# Patient Record
Sex: Male | Born: 1970
Health system: Southern US, Community
[De-identification: ages and names within clinical notes are randomized; demographics above are authoritative.]

## PROBLEM LIST (undated history)

## (undated) DIAGNOSIS — I10 Essential (primary) hypertension: Secondary | ICD-10-CM

## (undated) DIAGNOSIS — K219 Gastro-esophageal reflux disease without esophagitis: Secondary | ICD-10-CM

## (undated) DIAGNOSIS — T7840XA Allergy, unspecified, initial encounter: Secondary | ICD-10-CM

## (undated) DIAGNOSIS — K222 Esophageal obstruction: Secondary | ICD-10-CM

## (undated) HISTORY — DX: Gastro-esophageal reflux disease without esophagitis: K21.9

## (undated) HISTORY — DX: Essential (primary) hypertension: I10

## (undated) HISTORY — PX: WISDOM TOOTH EXTRACTION: SHX21

## (undated) HISTORY — DX: Allergy, unspecified, initial encounter: T78.40XA

## (undated) HISTORY — PX: UPPER GASTROINTESTINAL ENDOSCOPY: SHX188

## (undated) HISTORY — DX: Esophageal obstruction: K22.2

---

## 2001-07-11 ENCOUNTER — Emergency Department (HOSPITAL_COMMUNITY): Admission: EM | Admit: 2001-07-11 | Discharge: 2001-07-12 | Payer: Self-pay | Admitting: Emergency Medicine

## 2012-02-03 ENCOUNTER — Ambulatory Visit (INDEPENDENT_AMBULATORY_CARE_PROVIDER_SITE_OTHER): Payer: 59 | Admitting: Sports Medicine

## 2012-02-03 ENCOUNTER — Encounter: Payer: Self-pay | Admitting: Sports Medicine

## 2012-02-03 VITALS — BP 143/94 | HR 89

## 2012-02-03 DIAGNOSIS — M25519 Pain in unspecified shoulder: Secondary | ICD-10-CM | POA: Insufficient documentation

## 2012-02-03 DIAGNOSIS — M722 Plantar fascial fibromatosis: Secondary | ICD-10-CM

## 2012-02-03 MED ORDER — MELOXICAM 15 MG PO TABS: 15.0000 mg | ORAL_TABLET | Freq: Every day | ORAL | Status: DC

## 2012-02-03 NOTE — Progress Notes (Signed)
Bobby Rodriguez is a 41 y.o. male who presents to [facility] today for  1) left interior shoulder pain: Pain present for approximately 6 months. Patient notes anterior/superior shoulder pain especially with overhead weightlifting. For example he is no longer able to do [employer] press pain-free.  He has had decreased his weight from 220 pounds to 130 pounds.  He denies any locking or catching radicular pain weakness or numbness.  He notes additionally activities of daily living such as putting his children on her shoulders causes pain.  He was seen by his primary care doctor who obtained an x-ray which was normal and prescribed ibuprofen.  He has failed to improve significantly.  2) right heel pain: Present for approximately one month.  Patient has increased his running recently and recently played volleyball game which exacerbated his pain.  He notes pain especially with the first step after a prolonged period of rest.  He denies any weakness numbness into his foot. He has not tried any therapies for this yet.     PMH reviewed. Otherwise healthy no diagnosis of hypertension History  Substance Use Topics  . Smoking status: Never Smoker   . Smokeless tobacco: Former Neurosurgeon    Quit date: 06/15/2007  . Alcohol Use: Not on file   ROS as above otherwise neg   Exam:  BP 143/94  Pulse 89 Gen: Well NAD HEENT: MMM EOMI Lungs: Nl WOB CV: 2+ pulses BL MSK:  Left Shoulder: Normal in appearance and nontender. Range of motion external limited to about 85. Internal forward flexion and abduction are normal. Strength 5/5 in all modalities Hawkins: Mildly positive. Neers negative Empty can mildly positive O'Brien: Mildly positive Crossover: Positive Clunk and grind test negative Apprehension test: Negative  Right shoulder: Normal appearance nontender range of motion limited to 85 external rotation however normal otherwise. Strength full negative impingement tests negative labrum tests.  Right foot:    Morton's foot.  Mild collapse of longitudinal arch.  Mildly tender at the insertion of the plantar fascia calcaneus.  Normal foot motion.  Normal heel varus on toe standing.  Musculoskeletal ultrasound of the left shoulder:  Biceps tendon: Normal-appearing and groove on long and short views Subscapularis: Normal appearing on long and short views. No impingement.  Supraspinatus: Normal appearing on long and short views. No significant subacromial bursitis. Negative impingement testing.  Infraspinatus: Normal appearing on long and short views. Teres Minor: Normal appearing on long view Posterior labrum: Difficult to assess no significant pathology noted A.c. Joint: Positive mushroomed sign. No significant calcifications within the joint.  Right a.c. Is similar in appearance though

## 2012-02-03 NOTE — Assessment & Plan Note (Signed)
Unsure of etiology of left shoulder pain. Likely coming from a.c. joint possibly overuse.  No significant arthritic changes. The contralateral joint which is nontender has similar ultrasound changes.  Discussed the possibility of the ultrasound-guided a.c. joint injection.  Patient elects for trial of nonsteroidals and relative rest.  Will come back in 6-8 weeks if not improved for consideration of injection versus other therapy.

## 2012-02-03 NOTE — Patient Instructions (Addendum)
Thank you for coming in today. 1) Shoulder Pain: Likely AC joint irritation.  Do not think Rotator Cuff injury.  Continue light weights over head.  Meloxicam daily for 2 weeks then as needed.   2) Foot: Think Plantar Fasciitis.  This is pain and will take a while to get better.  Wear the insoles and try the arch straps.  Do the toe back massage stretch.  Do those calf raises 15 reps 3 sets 2-3 times a day.   Come back in 4-6 weeks as needed.  If your shoulder is not getting  Better we will try an injection.   Recheck your blood pressure at a pharmacy next week.

## 2012-02-03 NOTE — Assessment & Plan Note (Signed)
Right-sided plantar fasciitis. Will use temporary sports insoles with arch straps. Discussed eccentric calf exercises as well as stretching massage. Followup as noted above.

## 2012-12-28 ENCOUNTER — Ambulatory Visit (INDEPENDENT_AMBULATORY_CARE_PROVIDER_SITE_OTHER): Payer: 59 | Admitting: Internal Medicine

## 2012-12-28 ENCOUNTER — Other Ambulatory Visit (INDEPENDENT_AMBULATORY_CARE_PROVIDER_SITE_OTHER): Payer: 59

## 2012-12-28 ENCOUNTER — Encounter: Payer: Self-pay | Admitting: Internal Medicine

## 2012-12-28 VITALS — BP 140/94 | HR 100 | Temp 98.2°F | Resp 16 | Ht 73.0 in | Wt 244.0 lb

## 2012-12-28 DIAGNOSIS — Z23 Encounter for immunization: Secondary | ICD-10-CM

## 2012-12-28 DIAGNOSIS — K222 Esophageal obstruction: Secondary | ICD-10-CM | POA: Insufficient documentation

## 2012-12-28 DIAGNOSIS — I10 Essential (primary) hypertension: Secondary | ICD-10-CM

## 2012-12-28 DIAGNOSIS — R1310 Dysphagia, unspecified: Secondary | ICD-10-CM

## 2012-12-28 DIAGNOSIS — K219 Gastro-esophageal reflux disease without esophagitis: Secondary | ICD-10-CM | POA: Insufficient documentation

## 2012-12-28 DIAGNOSIS — Z0000 Encounter for general adult medical examination without abnormal findings: Secondary | ICD-10-CM

## 2012-12-28 LAB — CBC WITH DIFFERENTIAL/PLATELET
Basophils Absolute: 0 10*3/uL (ref 0.0–0.1)
Eosinophils Absolute: 0.7 10*3/uL (ref 0.0–0.7)
HCT: 43.9 % (ref 39.0–52.0)
Lymphs Abs: 2.7 10*3/uL (ref 0.7–4.0)
Monocytes Relative: 8.7 % (ref 3.0–12.0)
Platelets: 214 10*3/uL (ref 150.0–400.0)
RDW: 13.2 % (ref 11.5–14.6)

## 2012-12-28 LAB — URINALYSIS, ROUTINE W REFLEX MICROSCOPIC
Bilirubin Urine: NEGATIVE
Ketones, ur: NEGATIVE
RBC / HPF: NONE SEEN (ref 0–?)
Specific Gravity, Urine: 1.02 (ref 1.000–1.030)
Total Protein, Urine: NEGATIVE
pH: 6 (ref 5.0–8.0)

## 2012-12-28 LAB — COMPREHENSIVE METABOLIC PANEL
ALT: 76 U/L — ABNORMAL HIGH (ref 0–53)
CO2: 30 mEq/L (ref 19–32)
Calcium: 9.6 mg/dL (ref 8.4–10.5)
Chloride: 101 mEq/L (ref 96–112)
GFR: 81.19 mL/min (ref >60.00)
Sodium: 137 mEq/L (ref 135–145)
Total Bilirubin: 0.8 mg/dL (ref 0.3–1.2)
Total Protein: 7.3 g/dL (ref 6.0–8.3)

## 2012-12-28 LAB — FECAL OCCULT BLOOD, GUAIAC: Fecal Occult Blood: NEGATIVE

## 2012-12-28 LAB — LIPID PANEL: Total CHOL/HDL Ratio: 5

## 2012-12-28 MED ORDER — DEXLANSOPRAZOLE 60 MG PO CPDR: 60.0000 mg | DELAYED_RELEASE_CAPSULE | Freq: Every day | ORAL | Status: DC

## 2012-12-28 MED ORDER — OLMESARTAN MEDOXOMIL 40 MG PO TABS: 40.0000 mg | ORAL_TABLET | Freq: Every day | ORAL | Status: DC

## 2012-12-28 NOTE — Assessment & Plan Note (Signed)
Start dexilant I am concerned that he may have some UGI pathology (Barrett's, stricture, ulceration, etc.) so I have asked him to see GI

## 2012-12-28 NOTE — Assessment & Plan Note (Signed)
Start dexliant

## 2012-12-28 NOTE — Progress Notes (Signed)
Subjective:    Patient ID: Bobby Rodriguez, male    DOB: 23-May-1971, 42 y.o.   MRN: [id number]  Gastrophageal Reflux He complains of belching, coughing, dysphagia and heartburn. He reports no abdominal pain, no chest pain, no choking, no early satiety, no globus sensation, no hoarse voice, no nausea, no sore throat, no stridor, no tooth decay, no water brash or no wheezing. This is a chronic problem. The current episode started more than 1 year ago. The problem occurs frequently. The problem has been gradually worsening. The heartburn is of moderate intensity. The heartburn wakes him from sleep. The heartburn does not limit his activity. The heartburn doesn't change with position. Nothing aggravates the symptoms. Associated symptoms include fatigue. Pertinent negatives include no anemia, melena, muscle weakness, orthopnea or weight loss. Risk factors include obesity. He has tried a histamine-2 antagonist for the symptoms. The treatment provided no relief.      Review of Systems  Constitutional: Positive for fatigue. Negative for fever, chills, weight loss, diaphoresis, activity change, appetite change and unexpected weight change.  HENT: Negative.  Negative for sore throat and hoarse voice.   Eyes: Negative.   Respiratory: Positive for cough. Negative for apnea, choking, chest tightness, shortness of breath, wheezing and stridor.   Cardiovascular: Negative.  Negative for chest pain, palpitations and leg swelling.  Gastrointestinal: Positive for heartburn, dysphagia and anal bleeding. Negative for nausea, vomiting, abdominal pain, constipation, melena, abdominal distention and rectal pain.  Endocrine: Negative.   Genitourinary: Negative.   Musculoskeletal: Negative.  Negative for muscle weakness.  Skin: Negative.   Allergic/Immunologic: Negative.   Neurological: Negative.   Hematological: Negative.   Psychiatric/Behavioral: Negative.        Objective:   Physical Exam  Vitals  reviewed. Constitutional: He is oriented to person, place, and time. He appears well-developed and well-nourished. No distress.  HENT:  Head: Normocephalic and atraumatic.  Mouth/Throat: Oropharynx is clear and moist. No oropharyngeal exudate.  Eyes: Conjunctivae are normal. Right eye exhibits no discharge. Left eye exhibits no discharge. No scleral icterus.  Neck: Normal range of motion. Neck supple. No JVD present. No tracheal deviation present. No thyromegaly present.  Cardiovascular: Normal rate, regular rhythm, normal heart sounds and intact distal pulses.  Exam reveals no gallop and no friction rub.   No murmur heard. Pulmonary/Chest: Effort normal and breath sounds normal. No stridor. No respiratory distress. He has no wheezes. He has no rales. He exhibits no tenderness.  Abdominal: Soft. Bowel sounds are normal. He exhibits no distension and no mass. There is no tenderness. There is no rebound and no guarding. Hernia confirmed negative in the right inguinal area and confirmed negative in the left inguinal area.  Genitourinary: Rectum normal, testes normal and penis normal. Rectal exam shows no external hemorrhoid, no internal hemorrhoid, no fissure, no mass, no tenderness and anal tone normal. Guaiac negative stool. Prostate is not enlarged and not tender. Right testis shows no mass, no swelling and no tenderness. Right testis is descended. Left testis shows no mass, no swelling and no tenderness. Left testis is descended. Circumcised. No penile erythema or penile tenderness. No discharge found.  Musculoskeletal: Normal range of motion. He exhibits no edema and no tenderness.  Lymphadenopathy:    He has no cervical adenopathy.       Right: No inguinal adenopathy present.       Left: No inguinal adenopathy present.  Neurological: He is oriented to person, place, and time.  Skin: Skin is warm and dry.  No rash noted. He is not diaphoretic. No erythema. No pallor.  Psychiatric: He has a normal  mood and affect. His behavior is normal. Judgment and thought content normal.          Assessment & Plan:

## 2012-12-28 NOTE — Patient Instructions (Signed)
Health Maintenance, Males A healthy lifestyle and preventative care can promote health and wellness.  Maintain regular health, dental, and eye exams.  Eat a healthy diet. Foods like vegetables, fruits, whole grains, low-fat dairy products, and lean protein foods contain the nutrients you need without too many calories. Decrease your intake of foods high in solid fats, added sugars, and salt. Get information about a proper diet from your caregiver, if necessary.  Regular physical exercise is one of the most important things you can do for your health. Most adults should get at least 150 minutes of moderate-intensity exercise (any activity that increases your heart rate and causes you to sweat) each week. In addition, most adults need muscle-strengthening exercises on 2 or more days a week.   Maintain a healthy weight. The body mass index ([name]) is a screening tool to identify possible weight problems. It provides an estimate of body fat based on height and weight. Your caregiver can help determine your [name], and can help you achieve or maintain a healthy weight. For adults 20 years and older:  A [name] below 18.5 is considered underweight.  A [name] of 18.5 to 24.9 is normal.  A [name] of 25 to 29.9 is considered overweight.  A [name] of 30 and above is considered obese.  Maintain normal blood lipids and cholesterol by exercising and minimizing your intake of saturated fat. Eat a balanced diet with plenty of fruits and vegetables. Blood tests for lipids and cholesterol should begin at age 20 and be repeated every 5 years. If your lipid or cholesterol levels are high, you are over 50, or you are a high risk for heart disease, you may need your cholesterol levels checked more frequently.Ongoing high lipid and cholesterol levels should be treated with medicines, if diet and exercise are not effective.  If you smoke, find out from your caregiver how to quit. If you do not use tobacco, do not start.  If you  choose to drink alcohol, do not exceed 2 drinks per day. One drink is considered to be 12 ounces (355 mL) of beer, 5 ounces (148 mL) of wine, or 1.5 ounces (44 mL) of liquor.  Avoid use of street drugs. Do not share needles with anyone. Ask for help if you need support or instructions about stopping the use of drugs.  High blood pressure causes heart disease and increases the risk of stroke. Blood pressure should be checked at least every 1 to 2 years. Ongoing high blood pressure should be treated with medicines if weight loss and exercise are not effective.  If you are 45 to 43 years old, ask your caregiver if you should take aspirin to prevent heart disease.  Diabetes screening involves taking a blood sample to check your fasting blood sugar level. This should be done once every 3 years, after age 45, if you are within normal weight and without risk factors for diabetes. Testing should be considered at a younger age or be carried out more frequently if you are overweight and have at least 1 risk factor for diabetes.  Colorectal cancer can be detected and often prevented. Most routine colorectal cancer screening begins at the age of 50 and continues through age 75. However, your caregiver may recommend screening at an earlier age if you have risk factors for colon cancer. On a yearly basis, your caregiver may provide home test kits to check for hidden blood in the stool. Use of a small camera at the end of a tube,   to directly examine the colon (sigmoidoscopy or colonoscopy), can detect the earliest forms of colorectal cancer. Talk to your caregiver about this at age 50, when routine screening begins. Direct examination of the colon should be repeated every 5 to 10 years through age 75, unless early forms of pre-cancerous polyps or small growths are found.  Hepatitis C blood testing is recommended for all people born from 1945 through 1965 and any individual with known risks for hepatitis C.  Healthy  men should no longer receive prostate-specific antigen (PSA) blood tests as part of routine cancer screening. Consult with your caregiver about prostate cancer screening.  Testicular cancer screening is not recommended for adolescents or adult males who have no symptoms. Screening includes self-exam, caregiver exam, and other screening tests. Consult with your caregiver about any symptoms you have or any concerns you have about testicular cancer.  Practice safe sex. Use condoms and avoid high-risk sexual practices to reduce the spread of sexually transmitted infections (STIs).  Use sunscreen with a sun protection factor (SPF) of 30 or greater. Apply sunscreen liberally and repeatedly throughout the day. You should seek shade when your shadow is shorter than you. Protect yourself by wearing long sleeves, pants, a wide-brimmed hat, and sunglasses year round, whenever you are outdoors.  Notify your caregiver of new moles or changes in moles, especially if there is a change in shape or color. Also notify your caregiver if a mole is larger than the size of a pencil eraser.  A one-time screening for abdominal aortic aneurysm (AAA) and surgical repair of large AAAs by sound wave imaging (ultrasonography) is recommended for ages 65 to 75 years who are current or former smokers.  Stay current with your immunizations. Document Released: 11/27/2007 Document Revised: 08/23/2011 Document Reviewed: 10/26/2010 ExitCare Patient Information 2014 [employer]. Gastroesophageal Reflux Disease, Adult Gastroesophageal reflux disease (GERD) happens when acid from your stomach flows up into the esophagus. When acid comes in contact with the esophagus, the acid causes soreness (inflammation) in the esophagus. Over time, GERD may create small holes (ulcers) in the lining of the esophagus. CAUSES   Increased body weight. This puts pressure on the stomach, making acid rise from the stomach into the esophagus.  Smoking.  This increases acid production in the stomach.  Drinking alcohol. This causes decreased pressure in the lower esophageal sphincter (valve or ring of muscle between the esophagus and stomach), allowing acid from the stomach into the esophagus.  Late evening meals and a full stomach. This increases pressure and acid production in the stomach.  A malformed lower esophageal sphincter. Sometimes, no cause is found. SYMPTOMS   Burning pain in the lower part of the mid-chest behind the breastbone and in the mid-stomach area. This may occur twice a week or more often.  Trouble swallowing.  Sore throat.  Dry cough.  Asthma-like symptoms including chest tightness, shortness of breath, or wheezing. DIAGNOSIS  Your caregiver may be able to diagnose GERD based on your symptoms. In some cases, X-rays and other tests may be done to check for complications or to check the condition of your stomach and esophagus. TREATMENT  Your caregiver may recommend over-the-counter or prescription medicines to help decrease acid production. Ask your caregiver before starting or adding any new medicines.  HOME CARE INSTRUCTIONS   Change the factors that you can control. Ask your caregiver for guidance concerning weight loss, quitting smoking, and alcohol consumption.  Avoid foods and drinks that make your symptoms worse, such as:    Caffeine or alcoholic drinks.  Chocolate.  Peppermint or mint flavorings.  Garlic and onions.  Spicy foods.  Citrus fruits, such as oranges, lemons, or limes.  Tomato-based foods such as sauce, chili, salsa, and pizza.  Fried and fatty foods.  Avoid lying down for the 3 hours prior to your bedtime or prior to taking a nap.  Eat small, frequent meals instead of large meals.  Wear loose-fitting clothing. Do not wear anything tight around your waist that causes pressure on your stomach.  Raise the head of your bed 6 to 8 inches with wood blocks to help you sleep. Extra  pillows will not help.  Only take over-the-counter or prescription medicines for pain, discomfort, or fever as directed by your caregiver.  Do not take aspirin, ibuprofen, or other nonsteroidal anti-inflammatory drugs (NSAIDs). SEEK IMMEDIATE MEDICAL CARE IF:   You have pain in your arms, neck, jaw, teeth, or back.  Your pain increases or changes in intensity or duration.  You develop nausea, vomiting, or sweating (diaphoresis).  You develop shortness of breath, or you faint.  Your vomit is green, yellow, black, or looks like coffee grounds or blood.  Your stool is red, bloody, or black. These symptoms could be signs of other problems, such as heart disease, gastric bleeding, or esophageal bleeding. MAKE SURE YOU:   Understand these instructions.  Will watch your condition.  Will get help right away if you are not doing well or get worse. Document Released: 03/10/2005 Document Revised: 08/23/2011 Document Reviewed: 12/18/2010 ExitCare Patient Information 2014 [employer].  

## 2012-12-28 NOTE — Assessment & Plan Note (Signed)
I have asked him to start benicar Today I will check his labs to look for end organ damage and secondary causes of HTN He will work on his lifestyle modifications

## 2012-12-28 NOTE — Assessment & Plan Note (Signed)
Exam done Tdap was updated Labs ordered Pt ed material was given 

## 2013-01-02 ENCOUNTER — Encounter: Payer: Self-pay | Admitting: Gastroenterology

## 2013-01-24 ENCOUNTER — Encounter: Payer: Self-pay | Admitting: Gastroenterology

## 2013-01-24 ENCOUNTER — Ambulatory Visit (INDEPENDENT_AMBULATORY_CARE_PROVIDER_SITE_OTHER): Payer: 59 | Admitting: Gastroenterology

## 2013-01-24 VITALS — BP 110/70 | HR 88 | Ht 73.0 in | Wt 242.8 lb

## 2013-01-24 DIAGNOSIS — R1319 Other dysphagia: Secondary | ICD-10-CM

## 2013-01-24 NOTE — Patient Instructions (Addendum)
You have been scheduled for an endoscopy with propofol. Please follow written instructions given to you at your visit today. If you use inhalers (even only as needed), please bring them with you on the day of your procedure. Your physician has requested that you go to www.startemmi.com and enter the access code given to you at your visit today. This web site gives a general overview about your procedure. However, you should still follow specific instructions given to you by our office regarding your preparation for the procedure.  Continue Dexilant daily.  Thank you for choosing me and  Gastroenterology.  Venita Lick. Pleas Koch., MD., Clement[facility] Graham

## 2013-01-24 NOTE — Progress Notes (Signed)
History of Present Illness: This is a 42 year old male relates a history of a hiatal hernia and reflux symptoms beginning about 15 years ago. He had intermittent solid food dysphagia at that time. He was treated with Prilosec and his symptoms improved. Over the past few years he has had an increasing frequency of solid food dysphagia and his symptoms have been particularly bothersome for the past 6 months to the point where he has avoided eating steak and eats only softer foods. He was recently started on Dexilant and his symptoms have improved. Denies weight loss, abdominal pain, constipation, diarrhea, change in stool caliber, melena, hematochezia, nausea, vomiting, chest pain.  Review of Systems: Pertinent positive and negative review of systems were noted in the above HPI section. All other review of systems were otherwise negative.  Current Medications, Allergies, Past Medical History, Past Surgical History, Family History and Social History were reviewed in [employer] record.  Physical Exam: General: Well developed , well nourished, no acute distress Head: Normocephalic and atraumatic Eyes:  sclerae anicteric, EOMI Ears: Normal auditory acuity Mouth: No deformity or lesions Neck: Supple, no masses or thyromegaly Lungs: Clear throughout to auscultation Heart: Regular rate and rhythm; no murmurs, rubs or bruits Abdomen: Soft, non tender and non distended. No masses, hepatosplenomegaly or hernias noted. Normal Bowel sounds Musculoskeletal: Symmetrical with no gross deformities  Skin: No lesions on visible extremities Pulses:  Normal pulses noted Extremities: No clubbing, cyanosis, edema or deformities noted Neurological: Alert oriented x 4, grossly nonfocal Cervical Nodes:  No significant cervical adenopathy Inguinal Nodes: No significant inguinal adenopathy Psychological:  Alert and cooperative. Normal mood and affect  Assessment and Recommendations:  1.  Dysphagia and GERD. Rule out stricture, esophagitis, Barrett's. Continue Dexilant 60 mg daily and all standard antireflux measures. Schedule EGD with possible dilation. The risks, benefits, and alternatives to endoscopy with possible biopsy and possible dilation were discussed with the patient and they consent to proceed.

## 2013-01-30 ENCOUNTER — Ambulatory Visit (AMBULATORY_SURGERY_CENTER): Payer: 59 | Admitting: Gastroenterology

## 2013-01-30 ENCOUNTER — Encounter: Payer: Self-pay | Admitting: Gastroenterology

## 2013-01-30 VITALS — BP 108/59 | HR 74 | Temp 97.9°F | Resp 20 | Ht 73.0 in | Wt 242.0 lb

## 2013-01-30 DIAGNOSIS — K219 Gastro-esophageal reflux disease without esophagitis: Secondary | ICD-10-CM

## 2013-01-30 DIAGNOSIS — K222 Esophageal obstruction: Secondary | ICD-10-CM

## 2013-01-30 DIAGNOSIS — R1319 Other dysphagia: Secondary | ICD-10-CM

## 2013-01-30 MED ORDER — SODIUM CHLORIDE 0.9 % IV SOLN: 500.0000 mL | INTRAVENOUS | Status: DC

## 2013-01-30 NOTE — Op Note (Signed)
[facility] [address] [place], [address]   ENDOSCOPY PROCEDURE REPORT  PATIENT: Bobby, Rodriguez  MR#: [id number] BIRTHDATE: Apr 01, 1971 , 41  yrs. old GENDER: Male ENDOSCOPIST: Meryl Dare, MD, [facility] REFERRED BY:  Etta Grandchild, M.D. PROCEDURE DATE:  01/30/2013 PROCEDURE:  EGD, diagnostic and Savary dilation of esophagus ASA CLASS:     Class I INDICATIONS:  Dysphagia.  History of esophageal reflux. MEDICATIONS: MAC sedation, administered by CRNA and propofol (Diprivan) 300mg  IV TOPICAL ANESTHETIC: Cetacaine Spray DESCRIPTION OF PROCEDURE: After the risks benefits and alternatives of the procedure were thoroughly explained, informed consent was obtained.  The LB [id number] endoscope was introduced through the mouth and advanced to the second portion of the duodenum  without limitations.  The instrument was slowly withdrawn as the mucosa was fully examined.  ESOPHAGUS: A stricture was found at the gastroesophageal junction. The stenosis was traversable with the endoscope. It measured about 11 mm in diameter. The esophagus was otherwise normal. STOMACH: The mucosa and folds of the stomach appeared normal. DUODENUM: The duodenal mucosa showed no abnormalities in the bulb and second portion of the duodenum.  Retroflexed views revealed no abnormalities.  A guidewire was placed and the scope was then withdrawn from the patient. 13 mm, 14 mm and 15 mm Savary dilators were passed over the guidewire with mild resistance noted to all 3 and a trace of heme on the last dilator and the procedure completed.  COMPLICATIONS: There were no complications.  ENDOSCOPIC IMPRESSION: 1.   Stricture at the gastroesophageal junction; dilated 2.   The EGD was otherwise normal  RECOMMENDATIONS: 1.  Anti-reflux regimen 2.  Continue PPI 3.  post dilation instructions 4.  OP follow-up in 4-6 weeks to assess response-if additional dilation is needed   eSigned:   Meryl Dare, MD, [facility] 01/30/2013 3:25 PM

## 2013-01-30 NOTE — Progress Notes (Signed)
Called to room to assist during endoscopic procedure.  Patient ID and intended procedure confirmed with present staff. Received instructions for my participation in the procedure from the performing physician.  

## 2013-01-30 NOTE — Patient Instructions (Addendum)
YOU HAD AN ENDOSCOPIC PROCEDURE TODAY AT THE [facility]: Refer to the procedure report that was given to you for any specific questions about what was found during the examination.  If the procedure report does not answer your questions, please call your gastroenterologist to clarify.  If you requested that your care partner not be given the details of your procedure findings, then the procedure report has been included in a sealed envelope for you to review at your convenience later.  YOU SHOULD EXPECT: Some feelings of bloating in the abdomen. Passage of more gas than usual.  Walking can help get rid of the air that was put into your GI tract during the procedure and reduce the bloating. If you had a lower endoscopy (such as a colonoscopy or flexible sigmoidoscopy) you may notice spotting of blood in your stool or on the toilet paper. If you underwent a bowel prep for your procedure, then you may not have a normal bowel movement for a few days.  DIET: Your first meal following the procedure should be a light meal and then it is ok to progress to your normal diet.  A half-sandwich or bowl of soup is an example of a good first meal.  Heavy or fried foods are harder to digest and may make you feel nauseous or bloated.  Likewise meals heavy in dairy and vegetables can cause extra gas to form and this can also increase the bloating.  Drink plenty of fluids but you should avoid alcoholic beverages for 24 hours.  ACTIVITY: Your care partner should take you home directly after the procedure.  You should plan to take it easy, moving slowly for the rest of the day.  You can resume normal activity the day after the procedure however you should NOT DRIVE or use heavy machinery for 24 hours (because of the sedation medicines used during the test).    SYMPTOMS TO REPORT IMMEDIATELY: A gastroenterologist can be reached at any hour.  During normal business hours, 8:30 AM to 5:00 PM Monday through Friday,  call [phone].  After hours and on weekends, please call the GI answering service at [phone] who will take a message and have the physician on call contact you.    Following upper endoscopy (EGD)  Vomiting of blood or coffee ground material  New chest pain or pain under the shoulder blades  Painful or persistently difficult swallowing  New shortness of breath  Fever of 100F or higher  Black, tarry-looking stools  FOLLOW UP: If any biopsies were taken you will be contacted by phone or by letter within the next 1-3 weeks.  Call your gastroenterologist if you have not heard about the biopsies in 3 weeks.  Our staff will call the home number listed on your records the next business day following your procedure to check on you and address any questions or concerns that you may have at that time regarding the information given to you following your procedure. This is a courtesy call and so if there is no answer at the home number and we have not heard from you through the emergency physician on call, we will assume that you have returned to your regular daily activities without incident.  SIGNATURES/CONFIDENTIALITY: You and/or your care partner have signed paperwork which will be entered into your electronic medical record.  These signatures attest to the fact that that the information above on your After Visit Summary has been reviewed and is understood.  Full   responsibility of the confidentiality of this discharge information lies with you and/or your care-partner.  Stricture information given.  Anti-reflux regimen.  Continue PPI   Post dilation diet given with instructions.  Follow up in 4-6 weeks to access dilation results.   September 29th at 9:15 AM.

## 2013-01-30 NOTE — Progress Notes (Signed)
Patient did not experience any of the following events: a burn prior to discharge; a fall within the facility; wrong site/side/patient/procedure/implant event; or a hospital transfer or hospital admission upon discharge from the facility. (G8907) Patient did not have preoperative order for IV antibiotic SSI prophylaxis. (G8918)  

## 2013-01-31 ENCOUNTER — Telehealth: Payer: Self-pay | Admitting: *Deleted

## 2013-01-31 NOTE — Telephone Encounter (Signed)
  Follow up Call-  Call back number 01/30/2013  Post procedure Call Back phone  # [phone]  Permission to leave phone message Yes     Patient questions:  Do you have a fever, pain , or abdominal swelling? no Pain Score  0 *  Have you tolerated food without any problems? yes  Have you been able to return to your normal activities? yes  Do you have any questions about your discharge instructions: Diet   no Medications  no Follow up visit  no  Do you have questions or concerns about your Care? no  Actions: * If pain score is 4 or above: No action needed, pain <4.  Pt. Wife provided answers to questions,pt. Had already gone to work.

## 2013-02-16 ENCOUNTER — Encounter: Payer: 59 | Admitting: Gastroenterology

## 2013-02-20 ENCOUNTER — Encounter: Payer: Self-pay | Admitting: Internal Medicine

## 2013-02-20 ENCOUNTER — Other Ambulatory Visit (INDEPENDENT_AMBULATORY_CARE_PROVIDER_SITE_OTHER): Payer: 59

## 2013-02-20 ENCOUNTER — Ambulatory Visit (INDEPENDENT_AMBULATORY_CARE_PROVIDER_SITE_OTHER): Payer: 59 | Admitting: Internal Medicine

## 2013-02-20 ENCOUNTER — Telehealth: Payer: Self-pay | Admitting: Internal Medicine

## 2013-02-20 VITALS — BP 118/64 | HR 80 | Temp 98.5°F | Resp 16 | Wt 241.0 lb

## 2013-02-20 DIAGNOSIS — R0683 Snoring: Secondary | ICD-10-CM

## 2013-02-20 DIAGNOSIS — R7401 Elevation of levels of liver transaminase levels: Secondary | ICD-10-CM

## 2013-02-20 DIAGNOSIS — R0609 Other forms of dyspnea: Secondary | ICD-10-CM

## 2013-02-20 DIAGNOSIS — R5381 Other malaise: Secondary | ICD-10-CM

## 2013-02-20 DIAGNOSIS — I10 Essential (primary) hypertension: Secondary | ICD-10-CM

## 2013-02-20 DIAGNOSIS — R0989 Other specified symptoms and signs involving the circulatory and respiratory systems: Secondary | ICD-10-CM

## 2013-02-20 DIAGNOSIS — K760 Fatty (change of) liver, not elsewhere classified: Secondary | ICD-10-CM | POA: Insufficient documentation

## 2013-02-20 DIAGNOSIS — E669 Obesity, unspecified: Secondary | ICD-10-CM

## 2013-02-20 LAB — COMPREHENSIVE METABOLIC PANEL
AST: 64 U/L — ABNORMAL HIGH (ref 0–37)
Albumin: 4.4 g/dL (ref 3.5–5.2)
Alkaline Phosphatase: 41 U/L (ref 39–117)
Potassium: 4.3 mEq/L (ref 3.5–5.1)
Sodium: 138 mEq/L (ref 135–145)
Total Protein: 7.2 g/dL (ref 6.0–8.3)

## 2013-02-20 LAB — HEPATITIS B SURFACE ANTIBODY,QUALITATIVE: Hep B S Ab: NEGATIVE

## 2013-02-20 LAB — HEPATITIS C ANTIBODY: HCV Ab: NEGATIVE

## 2013-02-20 NOTE — Progress Notes (Signed)
  Subjective:    Patient ID: Bobby Rodriguez, male    DOB: [birth year], 42 y.o.   MRN: [id number]  Hypertension This is a chronic problem. The current episode started more than 1 month ago. The problem has been gradually improving since onset. The problem is controlled. Associated symptoms include malaise/fatigue. Pertinent negatives include no anxiety, blurred vision, chest pain, headaches, neck pain, orthopnea, palpitations, peripheral edema, PND, shortness of breath or sweats. Past treatments include angiotensin blockers. The current treatment provides moderate improvement. There are no compliance problems.       Review of Systems  Constitutional: Positive for malaise/fatigue and fatigue. Negative for fever, chills, diaphoresis, activity change, appetite change and unexpected weight change.  HENT: Negative.  Negative for neck pain.   Eyes: Negative.  Negative for blurred vision.  Respiratory: Negative.  Negative for cough, choking, chest tightness, shortness of breath and stridor.   Cardiovascular: Negative.  Negative for chest pain, palpitations, orthopnea, leg swelling and PND.  Gastrointestinal: Negative.  Negative for nausea, vomiting, abdominal pain, diarrhea, constipation and blood in stool.  Endocrine: Negative.   Genitourinary: Negative.   Musculoskeletal: Negative.   Skin: Negative.   Allergic/Immunologic: Negative.   Neurological: Negative.  Negative for dizziness, tremors, seizures, syncope, facial asymmetry, speech difficulty, weakness, light-headedness, numbness and headaches.  Hematological: Negative.  Negative for adenopathy. Does not bruise/bleed easily.  Psychiatric/Behavioral: Negative.        Objective:   Physical Exam  Vitals reviewed. Constitutional: He is oriented to person, place, and time. He appears well-developed and well-nourished. No distress.  HENT:  Head: Normocephalic and atraumatic.  Mouth/Throat: Oropharynx is clear and moist. No oropharyngeal  exudate.  Eyes: Conjunctivae are normal. Right eye exhibits no discharge. Left eye exhibits no discharge. No scleral icterus.  Neck: Normal range of motion. Neck supple. No JVD present. No tracheal deviation present. No thyromegaly present.  Cardiovascular: Normal rate, regular rhythm, normal heart sounds and intact distal pulses.  Exam reveals no gallop and no friction rub.   No murmur heard. Pulmonary/Chest: Effort normal and breath sounds normal. No stridor. No respiratory distress. He has no wheezes. He has no rales. He exhibits no tenderness.  Abdominal: Soft. Bowel sounds are normal. He exhibits no distension and no mass. There is no tenderness. There is no rebound and no guarding.  Musculoskeletal: Normal range of motion. He exhibits no edema and no tenderness.  Lymphadenopathy:    He has no cervical adenopathy.  Neurological: He is oriented to person, place, and time.  Skin: Skin is warm and dry. No rash noted. He is not diaphoretic. No erythema. No pallor.  Psychiatric: He has a normal mood and affect. His behavior is normal. Judgment and thought content normal.     Lab Results  Component Value Date   WBC 8.9 12/28/2012   HGB 15.4 12/28/2012   HCT 43.9 12/28/2012   PLT 214.0 12/28/2012   GLUCOSE 81 12/28/2012   CHOL 201* 12/28/2012   TRIG 102.0 12/28/2012   HDL 37.90* 12/28/2012   LDLDIRECT 148.8 12/28/2012   ALT 76* 12/28/2012   AST 46* 12/28/2012   NA 137 12/28/2012   K 4.1 12/28/2012   CL 101 12/28/2012   CREATININE 1.1 12/28/2012   BUN 15 12/28/2012   CO2 30 12/28/2012   TSH 3.15 12/28/2012       Assessment & Plan:

## 2013-02-20 NOTE — Patient Instructions (Signed)

## 2013-02-20 NOTE — Telephone Encounter (Signed)
Error

## 2013-02-21 DIAGNOSIS — E669 Obesity, unspecified: Secondary | ICD-10-CM | POA: Insufficient documentation

## 2013-02-21 LAB — TESTOSTERONE, FREE, TOTAL, SHBG
Testosterone, Free: 77.8 pg/mL (ref 47.0–244.0)
Testosterone-% Free: 2.3 % (ref 1.6–2.9)

## 2013-02-21 LAB — HEPATITIS A ANTIBODY, TOTAL: Hep A Total Ab: NEGATIVE

## 2013-02-21 LAB — HEPATITIS B CORE ANTIBODY, TOTAL: Hep B Core Total Ab: NEGATIVE

## 2013-02-21 NOTE — Assessment & Plan Note (Signed)
His BP is well controlled Today I will check his lytes and renal function 

## 2013-02-21 NOTE — Assessment & Plan Note (Signed)
I will check his testosterone level to see if that it the cause for his fatigue. He does report snoring but his wife does not notice apnea, if the T level is normal then I will refer him for a sleep evaluation

## 2013-02-21 NOTE — Assessment & Plan Note (Signed)
He is working on his lifestyle modifications to lose weight 

## 2013-02-21 NOTE — Assessment & Plan Note (Signed)
This appears to be fatty liver disease I will recheck his LFT's today and will screen him for viral hepatitis

## 2013-02-22 ENCOUNTER — Encounter: Payer: Self-pay | Admitting: Internal Medicine

## 2013-02-22 DIAGNOSIS — R0683 Snoring: Secondary | ICD-10-CM | POA: Insufficient documentation

## 2013-02-22 NOTE — Addendum Note (Signed)
Addended by: Etta Grandchild on: 02/22/2013 08:25 AM   Modules accepted: Orders

## 2013-03-12 ENCOUNTER — Encounter: Payer: Self-pay | Admitting: Gastroenterology

## 2013-03-12 ENCOUNTER — Ambulatory Visit (INDEPENDENT_AMBULATORY_CARE_PROVIDER_SITE_OTHER): Payer: 59 | Admitting: Gastroenterology

## 2013-03-12 VITALS — BP 126/76 | HR 84 | Ht 72.25 in | Wt 248.5 lb

## 2013-03-12 DIAGNOSIS — K222 Esophageal obstruction: Secondary | ICD-10-CM

## 2013-03-12 DIAGNOSIS — K219 Gastro-esophageal reflux disease without esophagitis: Secondary | ICD-10-CM

## 2013-03-12 NOTE — Patient Instructions (Addendum)
Thank you for choosing me and [facility].  Malcolm T. Stark, Jr., MD., FACG  

## 2013-03-12 NOTE — Progress Notes (Signed)
History of Present Illness: This is a 42 year old male with GERD and peptic stricture. He recently underwent upper endoscopy with dilation. He has no dysphasia no reflux symptoms. He has no GI complaints.  Current Medications, Allergies, Past Medical History, Past Surgical History, Family History and Social History were reviewed in [employer] record.  Physical Exam: General: Well developed , well nourished, no acute distress Head: Normocephalic and atraumatic Eyes:  sclerae anicteric, EOMI Ears: Normal auditory acuity Mouth: No deformity or lesions Lungs: Clear throughout to auscultation Heart: Regular rate and rhythm; no murmurs, rubs or bruits Abdomen: Soft, non tender and non distended. No masses, hepatosplenomegaly or hernias noted. Normal Bowel sounds Musculoskeletal: Symmetrical with no gross deformities  Pulses:  Normal pulses noted Extremities: No clubbing, cyanosis, edema or deformities noted Neurological: Alert oriented x 4, grossly nonfocal Psychological:  Alert and cooperative. Normal mood and affect  Assessment and Recommendations:  1. GERD with peptic stricture. Continue daily PPI long-term along with antireflux measures. Followup as needed for recurrent dysphagia.  2. Colorectal cancer screening, average risk. Colonoscopy at age 63.

## 2013-03-27 ENCOUNTER — Institutional Professional Consult (permissible substitution): Payer: 59 | Admitting: Pulmonary Disease

## 2013-04-30 ENCOUNTER — Ambulatory Visit (INDEPENDENT_AMBULATORY_CARE_PROVIDER_SITE_OTHER): Payer: 59 | Admitting: Pulmonary Disease

## 2013-04-30 ENCOUNTER — Encounter: Payer: Self-pay | Admitting: Pulmonary Disease

## 2013-04-30 VITALS — BP 118/88 | HR 97 | Temp 98.0°F | Ht 73.0 in | Wt 239.0 lb

## 2013-04-30 DIAGNOSIS — R0989 Other specified symptoms and signs involving the circulatory and respiratory systems: Secondary | ICD-10-CM

## 2013-04-30 DIAGNOSIS — R0609 Other forms of dyspnea: Secondary | ICD-10-CM

## 2013-04-30 DIAGNOSIS — R0683 Snoring: Secondary | ICD-10-CM

## 2013-04-30 NOTE — Progress Notes (Signed)
Subjective:    Patient ID: Bobby Rodriguez, male    DOB: [birth year], 42 y.o.   MRN: [id number]  HPI The patient is a 42 year old male who I've been asked to see for possible obstructive sleep apnea.  He has been noted to have loud snoring by his wife, but she has never commented on an abnormal breathing pattern during sleep.  The patient denies having gasping arousals.  He only awakens once a night at the most, but is only rested 50% in the mornings upon arising.  He notes significant fatigue during the day as well as sleep pressure with periods of inactivity.  This can bother him occasionally while driving with his work.  He has some sleepiness in the evenings watching television or movies.  The patient states that his weight is up 20 pounds over the last few years, and his Epworth score today is abnormal at 14.   Sleep Questionnaire What time do you typically go to bed?( Between what hours) 10-11p 10-11p at 1451 on 04/30/13 by Maisie Fus, CMA How long does it take you to fall asleep? 10-68mins 10-63mins at 1451 on 04/30/13 by Maisie Fus, CMA How many times during the night do you wake up? 1 1 at 1451 on 04/30/13 by Maisie Fus, CMA What time do you get out of bed to start your day? 0600 0600 at 1451 on 04/30/13 by Maisie Fus, CMA Do you drive or operate heavy machinery in your occupation? No No at 1451 on 04/30/13 by Maisie Fus, CMA How much has your weight changed (up or down) over the past two years? (In pounds) 20 lb (9.072 kg)20 lb (9.072 kg) increase at 1451 on 04/30/13 by Maisie Fus, CMA Have you ever had a sleep study before? No No at 1451 on 04/30/13 by Maisie Fus, CMA Do you currently use CPAP? No No at 1451 on 04/30/13 by Maisie Fus, CMA Do you wear oxygen at any time? No No at 1451 on 04/30/13 by Maisie Fus, CMA   Review of Systems  Constitutional: Negative for fever and unexpected weight change.  HENT: Negative for congestion,  dental problem, ear pain, nosebleeds, postnasal drip, rhinorrhea, sinus pressure, sneezing, sore throat and trouble swallowing.   Eyes: Negative for redness and itching.  Respiratory: Negative for cough, chest tightness, shortness of breath and wheezing.   Cardiovascular: Negative for palpitations and leg swelling.  Gastrointestinal: Negative for nausea and vomiting.  Genitourinary: Negative for dysuria.  Musculoskeletal: Negative for joint swelling.  Skin: Negative for rash.  Neurological: Negative for headaches.  Hematological: Does not bruise/bleed easily.  Psychiatric/Behavioral: Negative for dysphoric mood. The patient is not nervous/anxious.        Objective:   Physical Exam Constitutional:  Overweight male, no acute distress  HENT:  Nares patent without discharge, septal deviation to left with narrowing.  Oropharynx without exudate, palate and uvula are moderately elongated.   Eyes:  Perrla, eomi, no scleral icterus  Neck:  No JVD, no TMG  Cardiovascular:  Normal rate, regular rhythm, no rubs or gallops.  No murmurs        Intact distal pulses  Pulmonary :  Normal breath sounds, no stridor or respiratory distress   No rales, rhonchi, or wheezing  Abdominal:  Soft, nondistended, bowel sounds present.  No tenderness noted.   Musculoskeletal:  No lower extremity edema noted.  Lymph Nodes:  No cervical lymphadenopathy noted  Skin:  No cyanosis noted  Neurologic:  Alert, appropriate, moves all 4 extremities without obvious deficit.         Assessment & Plan:

## 2013-04-30 NOTE — Assessment & Plan Note (Signed)
The patient has a history of loud snoring, as well as nonrestorative sleep and some degree of daytime sleepiness and fatigue.  It is unclear whether he has actual sleep apnea, or whether this is the upper airway resistance syndrome.  I have outlined a conservative approach with a trial of weight loss alone, versus proceeding with a sleep study.  The patient at this time would like to try and work on weight loss, however I have asked him to call me to arrange for a sleep study if he is unsuccessful.  He is agreeable to this approach.

## 2013-04-30 NOTE — Patient Instructions (Signed)
Work on weight loss for the next 6mos.  If you are making progress and feeling better, continue.   If you are not losing weight successfully, then would consider doing sleep study (if you have sleep apnea and we treat, then you may lose weight easier).  Please call to set up sleep study if you are not able to lose weight successfully.

## 2013-06-11 ENCOUNTER — Other Ambulatory Visit: Payer: Self-pay | Admitting: Internal Medicine

## 2013-11-21 ENCOUNTER — Other Ambulatory Visit: Payer: Self-pay | Admitting: Internal Medicine

## 2014-01-24 ENCOUNTER — Other Ambulatory Visit: Payer: Self-pay | Admitting: Internal Medicine

## 2014-02-22 ENCOUNTER — Encounter: Payer: Self-pay | Admitting: Internal Medicine

## 2014-02-22 ENCOUNTER — Other Ambulatory Visit (INDEPENDENT_AMBULATORY_CARE_PROVIDER_SITE_OTHER): Payer: 59

## 2014-02-22 ENCOUNTER — Ambulatory Visit (INDEPENDENT_AMBULATORY_CARE_PROVIDER_SITE_OTHER): Payer: 59 | Admitting: Internal Medicine

## 2014-02-22 VITALS — BP 124/80 | HR 75 | Temp 98.5°F | Resp 16 | Wt 249.0 lb

## 2014-02-22 DIAGNOSIS — R7309 Other abnormal glucose: Secondary | ICD-10-CM

## 2014-02-22 DIAGNOSIS — Z0000 Encounter for general adult medical examination without abnormal findings: Secondary | ICD-10-CM

## 2014-02-22 DIAGNOSIS — E669 Obesity, unspecified: Secondary | ICD-10-CM

## 2014-02-22 DIAGNOSIS — R7401 Elevation of levels of liver transaminase levels: Secondary | ICD-10-CM

## 2014-02-22 DIAGNOSIS — R740 Nonspecific elevation of levels of transaminase and lactic acid dehydrogenase [LDH]: Secondary | ICD-10-CM

## 2014-02-22 DIAGNOSIS — I10 Essential (primary) hypertension: Secondary | ICD-10-CM

## 2014-02-22 LAB — COMPREHENSIVE METABOLIC PANEL
ALK PHOS: 39 U/L (ref 39–117)
ALT: 115 U/L — AB (ref 0–53)
AST: 66 U/L — AB (ref 0–37)
Albumin: 4.2 g/dL (ref 3.5–5.2)
BILIRUBIN TOTAL: 0.9 mg/dL (ref 0.2–1.2)
BUN: 14 mg/dL (ref 6–23)
CALCIUM: 9.1 mg/dL (ref 8.4–10.5)
CHLORIDE: 101 meq/L (ref 96–112)
CO2: 28 mEq/L (ref 19–32)
CREATININE: 1.1 mg/dL (ref 0.4–1.5)
GFR: 80.75 mL/min (ref >60.00)
Glucose, Bld: 103 mg/dL — ABNORMAL HIGH (ref 70–99)
Potassium: 4.5 mEq/L (ref 3.5–5.1)
Sodium: 136 mEq/L (ref 135–145)
Total Protein: 7.2 g/dL (ref 6.0–8.3)

## 2014-02-22 LAB — LIPID PANEL
CHOL/HDL RATIO: 7
CHOLESTEROL: 228 mg/dL — AB (ref 0–200)
HDL: 34.4 mg/dL — ABNORMAL LOW (ref >39.00)
LDL CALC: 169 mg/dL — AB (ref 0–99)
NonHDL: 193.6
Triglycerides: 121 mg/dL (ref 0.0–149.0)
VLDL: 24.2 mg/dL (ref 0.0–40.0)

## 2014-02-22 LAB — CBC WITH DIFFERENTIAL/PLATELET
Basophils Absolute: 0 10*3/uL (ref 0.0–0.1)
Basophils Relative: 0.5 % (ref 0.0–3.0)
EOS ABS: 0.4 10*3/uL (ref 0.0–0.7)
Eosinophils Relative: 5.9 % — ABNORMAL HIGH (ref 0.0–5.0)
HEMATOCRIT: 42.6 % (ref 39.0–52.0)
HEMOGLOBIN: 14.6 g/dL (ref 13.0–17.0)
LYMPHS ABS: 2.8 10*3/uL (ref 0.7–4.0)
Lymphocytes Relative: 37.9 % (ref 12.0–46.0)
MCHC: 34.3 g/dL (ref 30.0–36.0)
MCV: 94.3 fl (ref 78.0–100.0)
Monocytes Absolute: 0.7 10*3/uL (ref 0.1–1.0)
Monocytes Relative: 9.9 % (ref 3.0–12.0)
NEUTROS ABS: 3.4 10*3/uL (ref 1.4–7.7)
Neutrophils Relative %: 45.8 % (ref 43.0–77.0)
Platelets: 214 10*3/uL (ref 150.0–400.0)
RBC: 4.52 Mil/uL (ref 4.22–5.81)
RDW: 12.7 % (ref 11.5–15.5)
WBC: 7.5 10*3/uL (ref 4.0–10.5)

## 2014-02-22 LAB — TSH: TSH: 4.64 u[IU]/mL — ABNORMAL HIGH (ref 0.35–4.50)

## 2014-02-22 LAB — HEMOGLOBIN A1C: Hgb A1c MFr Bld: 5.3 % (ref 4.6–6.5)

## 2014-02-22 NOTE — Assessment & Plan Note (Signed)
His BP is well controlled 

## 2014-02-22 NOTE — Assessment & Plan Note (Signed)
Exam done Vaccines were reviewed and updated Labs ordered Pt ed material was given 

## 2014-02-22 NOTE — Progress Notes (Signed)
Subjective:    Patient ID: Bobby Rodriguez, male    DOB: 01/27/1971, 43 y.o.   MRN: [id number]  Hypertension This is a chronic problem. The current episode started more than 1 year ago. The problem has been gradually improving since onset. The problem is controlled. Pertinent negatives include no anxiety, blurred vision, chest pain, headaches, malaise/fatigue, neck pain, orthopnea, palpitations, peripheral edema, PND, shortness of breath or sweats. Agents associated with hypertension include NSAIDs. Risk factors for coronary artery disease include obesity and male gender. Past treatments include angiotensin blockers. The current treatment provides moderate improvement. There are no compliance problems.       Review of Systems  Constitutional: Negative.  Negative for fever, chills, malaise/fatigue, diaphoresis, appetite change and fatigue.  HENT: Negative.   Eyes: Negative.  Negative for blurred vision.  Respiratory: Negative for apnea, cough, choking, chest tightness, shortness of breath, wheezing and stridor.   Cardiovascular: Negative.  Negative for chest pain, palpitations, orthopnea, leg swelling and PND.  Gastrointestinal: Negative.  Negative for nausea, vomiting, abdominal pain, diarrhea, constipation and blood in stool.  Endocrine: Negative.   Genitourinary: Negative.   Musculoskeletal: Negative.  Negative for neck pain.  Skin: Negative.   Allergic/Immunologic: Negative.   Neurological: Negative.  Negative for tremors, seizures, syncope, facial asymmetry, speech difficulty, weakness, light-headedness, numbness and headaches.  Hematological: Negative.  Negative for adenopathy. Does not bruise/bleed easily.  Psychiatric/Behavioral: Negative.        Objective:   Physical Exam  Vitals reviewed. Constitutional: He is oriented to person, place, and time. He appears well-developed and well-nourished. No distress.  HENT:  Head: Normocephalic and atraumatic.  Mouth/Throat:  Oropharynx is clear and moist. No oropharyngeal exudate.  Eyes: Conjunctivae are normal. Right eye exhibits no discharge. Left eye exhibits no discharge. No scleral icterus.  Neck: Normal range of motion. Neck supple. No JVD present. No tracheal deviation present. No thyromegaly present.  Cardiovascular: Normal rate, regular rhythm, normal heart sounds and intact distal pulses.  Exam reveals no gallop and no friction rub.   No murmur heard. Pulmonary/Chest: Effort normal and breath sounds normal. No stridor. No respiratory distress. He has no wheezes. He has no rales. He exhibits no tenderness.  Abdominal: Soft. Normal appearance and bowel sounds are normal. He exhibits no shifting dullness, no distension, no pulsatile liver, no fluid wave, no abdominal bruit, no ascites, no pulsatile midline mass and no mass. There is no hepatosplenomegaly, splenomegaly or hepatomegaly. There is no tenderness. There is no rebound, no guarding and no CVA tenderness. No hernia. Hernia confirmed negative in the ventral area, confirmed negative in the right inguinal area and confirmed negative in the left inguinal area.  Genitourinary: Testes normal and penis normal. Right testis shows no mass, no swelling and no tenderness. Right testis is descended. Left testis shows no mass, no swelling and no tenderness. Left testis is descended. Circumcised. No penile erythema or penile tenderness. No discharge found.  Musculoskeletal: Normal range of motion. He exhibits no edema and no tenderness.  Lymphadenopathy:    He has no cervical adenopathy.       Right: No inguinal adenopathy present.       Left: No inguinal adenopathy present.  Neurological: He is oriented to person, place, and time.  Skin: Skin is warm and dry. No rash noted. He is not diaphoretic. No erythema. No pallor.     Lab Results  Component Value Date   WBC 8.9 12/28/2012   HGB 15.4 12/28/2012   HCT  43.9 12/28/2012   PLT 214.0 12/28/2012   GLUCOSE 102*  02/20/2013   CHOL 201* 12/28/2012   TRIG 102.0 12/28/2012   HDL 37.90* 12/28/2012   LDLDIRECT 148.8 12/28/2012   ALT 105* 02/20/2013   AST 64* 02/20/2013   NA 138 02/20/2013   K 4.3 02/20/2013   CL 103 02/20/2013   CREATININE 1.2 02/20/2013   BUN 14 02/20/2013   CO2 28 02/20/2013   TSH 3.15 12/28/2012       Assessment & Plan:

## 2014-02-22 NOTE — Assessment & Plan Note (Signed)
He is working on his lifestyle modifications 

## 2014-02-22 NOTE — Progress Notes (Signed)
Pre visit review using our clinic review tool, if applicable. No additional management support is needed unless otherwise documented below in the visit note. 

## 2014-02-22 NOTE — Patient Instructions (Signed)
Hypertension Hypertension, commonly called high blood pressure, is when the force of blood pumping through your arteries is too strong. Your arteries are the blood vessels that carry blood from your heart throughout your body. A blood pressure reading consists of a higher number over a lower number, such as 110/72. The higher number (systolic) is the pressure inside your arteries when your heart pumps. The lower number (diastolic) is the pressure inside your arteries when your heart relaxes. Ideally you want your blood pressure below 120/80. Hypertension forces your heart to work harder to pump blood. Your arteries may become narrow or stiff. Having hypertension puts you at risk for heart disease, stroke, and other problems.  RISK FACTORS Some risk factors for high blood pressure are controllable. Others are not.  Risk factors you cannot control include:   Race. You may be at higher risk if you are African American.  Age. Risk increases with age.  Gender. Men are at higher risk than women before age 45 years. After age 65, women are at higher risk than men. Risk factors you can control include:  Not getting enough exercise or physical activity.  Being overweight.  Getting too much fat, sugar, calories, or salt in your diet.  Drinking too much alcohol. SIGNS AND SYMPTOMS Hypertension does not usually cause signs or symptoms. Extremely high blood pressure (hypertensive crisis) may cause headache, anxiety, shortness of breath, and nosebleed. DIAGNOSIS  To check if you have hypertension, your health care provider will measure your blood pressure while you are seated, with your arm held at the level of your heart. It should be measured at least twice using the same arm. Certain conditions can cause a difference in blood pressure between your right and left arms. A blood pressure reading that is higher than normal on one occasion does not mean that you need treatment. If one blood pressure reading  is high, ask your health care provider about having it checked again. TREATMENT  Treating high blood pressure includes making lifestyle changes and possibly taking medicine. Living a healthy lifestyle can help lower high blood pressure. You may need to change some of your habits. Lifestyle changes may include:  Following the DASH diet. This diet is high in fruits, vegetables, and whole grains. It is low in salt, red meat, and added sugars.  Getting at least 2 hours of brisk physical activity every week.  Losing weight if necessary.  Not smoking.  Limiting alcoholic beverages.  Learning ways to reduce stress. If lifestyle changes are not enough to get your blood pressure under control, your health care provider may prescribe medicine. You may need to take more than one. Work closely with your health care provider to understand the risks and benefits. HOME CARE INSTRUCTIONS  Have your blood pressure rechecked as directed by your health care provider.   Take medicines only as directed by your health care provider. Follow the directions carefully. Blood pressure medicines must be taken as prescribed. The medicine does not work as well when you skip doses. Skipping doses also puts you at risk for problems.   Do not smoke.   Monitor your blood pressure at home as directed by your health care provider. SEEK MEDICAL CARE IF:   You think you are having a reaction to medicines taken.  You have recurrent headaches or feel dizzy.  You have swelling in your ankles.  You have trouble with your vision. SEEK IMMEDIATE MEDICAL CARE IF:  You develop a severe headache or confusion.    You have unusual weakness, numbness, or feel faint.  You have severe chest or abdominal pain.  You vomit repeatedly.  You have trouble breathing. MAKE SURE YOU:   Understand these instructions.  Will watch your condition.  Will get help right away if you are not doing well or get worse. Document  Released: 05/31/2005 Document Revised: 10/15/2013 Document Reviewed: 03/23/2013 Exit[employer] Patient Information 2015 [employer]. This information is not intended to replace advice given to you by your health care provider. Make sure you discuss any questions you have with your health care provider. Health Maintenance A healthy lifestyle and preventative care can promote health and wellness.  Maintain regular health, dental, and eye exams.  Eat a healthy diet. Foods like vegetables, fruits, whole grains, low-fat dairy products, and lean protein foods contain the nutrients you need and are low in calories. Decrease your intake of foods high in solid fats, added sugars, and salt. Get information about a proper diet from your health care provider, if necessary.  Regular physical exercise is one of the most important things you can do for your health. Most adults should get at least 150 minutes of moderate-intensity exercise (any activity that increases your heart rate and causes you to sweat) each week. In addition, most adults need muscle-strengthening exercises on 2 or more days a week.   Maintain a healthy weight. The body mass index ([name]) is a screening tool to identify possible weight problems. It provides an estimate of body fat based on height and weight. Your health care provider can find your [name] and can help you achieve or maintain a healthy weight. For males 20 years and older:  A [name] below 18.5 is considered underweight.  A [name] of 18.5 to 24.9 is normal.  A [name] of 25 to 29.9 is considered overweight.  A [name] of 30 and above is considered obese.  Maintain normal blood lipids and cholesterol by exercising and minimizing your intake of saturated fat. Eat a balanced diet with plenty of fruits and vegetables. Blood tests for lipids and cholesterol should begin at age 20 and be repeated every 5 years. If your lipid or cholesterol levels are high, you are over age 50, or you are at high risk  for heart disease, you may need your cholesterol levels checked more frequently.Ongoing high lipid and cholesterol levels should be treated with medicines if diet and exercise are not working.  If you smoke, find out from your health care provider how to quit. If you do not use tobacco, do not start.  Lung cancer screening is recommended for adults aged 55-80 years who are at high risk for developing lung cancer because of a history of smoking. A yearly low-dose CT scan of the lungs is recommended for people who have at least a 30-pack-year history of smoking and are current smokers or have quit within the past 15 years. A pack year of smoking is smoking an average of 1 pack of cigarettes a day for 1 year (for example, a 30-pack-year history of smoking could mean smoking 1 pack a day for 30 years or 2 packs a day for 15 years). Yearly screening should continue until the smoker has stopped smoking for at least 15 years. Yearly screening should be stopped for people who develop a health problem that would prevent them from having lung cancer treatment.  If you choose to drink alcohol, do not have more than 2 drinks per day. One drink is considered to be 12 oz (  360 mL) of beer, 5 oz (150 mL) of wine, or 1.5 oz (45 mL) of liquor.  Avoid the use of street drugs. Do not share needles with anyone. Ask for help if you need support or instructions about stopping the use of drugs.  High blood pressure causes heart disease and increases the risk of stroke. Blood pressure should be checked at least every 1-2 years. Ongoing high blood pressure should be treated with medicines if weight loss and exercise are not effective.  If you are 45-79 years old, ask your health care provider if you should take aspirin to prevent heart disease.  Diabetes screening involves taking a blood sample to check your fasting blood sugar level. This should be done once every 3 years after age 45 if you are at a normal weight and without  risk factors for diabetes. Testing should be considered at a younger age or be carried out more frequently if you are overweight and have at least 1 risk factor for diabetes.  Colorectal cancer can be detected and often prevented. Most routine colorectal cancer screening begins at the age of 50 and continues through age 75. However, your health care provider may recommend screening at an earlier age if you have risk factors for colon cancer. On a yearly basis, your health care provider may provide home test kits to check for hidden blood in the stool. A small camera at the end of a tube may be used to directly examine the colon (sigmoidoscopy or colonoscopy) to detect the earliest forms of colorectal cancer. Talk to your health care provider about this at age 50 when routine screening begins. A direct exam of the colon should be repeated every 5-10 years through age 75, unless early forms of precancerous polyps or small growths are found.  People who are at an increased risk for hepatitis B should be screened for this virus. You are considered at high risk for hepatitis B if:  You were born in a country where hepatitis B occurs often. Talk with your health care provider about which countries are considered high risk.  Your parents were born in a high-risk country and you have not received a shot to protect against hepatitis B (hepatitis B vaccine).  You have HIV or AIDS.  You use needles to inject street drugs.  You live with, or have sex with, someone who has hepatitis B.  You are a man who has sex with other men (MSM).  You get hemodialysis treatment.  You take certain medicines for conditions like cancer, organ transplantation, and autoimmune conditions.  Hepatitis C blood testing is recommended for all people born from 1945 through 1965 and any individual with known risk factors for hepatitis C.  Healthy men should no longer receive prostate-specific antigen (PSA) blood tests as part of  routine cancer screening. Talk to your health care provider about prostate cancer screening.  Testicular cancer screening is not recommended for adolescents or adult males who have no symptoms. Screening includes self-exam, a health care provider exam, and other screening tests. Consult with your health care provider about any symptoms you have or any concerns you have about testicular cancer.  Practice safe sex. Use condoms and avoid high-risk sexual practices to reduce the spread of sexually transmitted infections (STIs).  You should be screened for STIs, including gonorrhea and chlamydia if:  You are sexually active and are younger than 24 years.  You are older than 24 years, and your health care provider tells you   that you are at risk for this type of infection.  Your sexual activity has changed since you were last screened, and you are at an increased risk for chlamydia or gonorrhea. Ask your health care provider if you are at risk.  If you are at risk of being infected with HIV, it is recommended that you take a prescription medicine daily to prevent HIV infection. This is called pre-exposure prophylaxis (PrEP). You are considered at risk if:  You are a man who has sex with other men (MSM).  You are a heterosexual man who is sexually active with multiple partners.  You take drugs by injection.  You are sexually active with a partner who has HIV.  Talk with your health care provider about whether you are at high risk of being infected with HIV. If you choose to begin PrEP, you should first be tested for HIV. You should then be tested every 3 months for as long as you are taking PrEP.  Use sunscreen. Apply sunscreen liberally and repeatedly throughout the day. You should seek shade when your shadow is shorter than you. Protect yourself by wearing long sleeves, pants, a wide-brimmed hat, and sunglasses year round whenever you are outdoors.  Tell your health care provider of new moles  or changes in moles, especially if there is a change in shape or color. Also, tell your health care provider if a mole is larger than the size of a pencil eraser.  A one-time screening for abdominal aortic aneurysm (AAA) and surgical repair of large AAAs by ultrasound is recommended for men aged 65-75 years who are current or former smokers.  Stay current with your vaccines (immunizations). Document Released: 11/27/2007 Document Revised: 06/05/2013 Document Reviewed: 10/26/2010 ExitCare Patient Information 2015 [employer]. This information is not intended to replace advice given to you by your health care provider. Make sure you discuss any questions you have with your health care provider.  

## 2014-02-22 NOTE — Assessment & Plan Note (Signed)
LFT's remain elevated I think he has fatty liver, will get an U/S for confirmation

## 2014-02-22 NOTE — Assessment & Plan Note (Signed)
I will check his A1C to see if he has developed DM2 

## 2014-03-04 ENCOUNTER — Encounter: Payer: Self-pay | Admitting: Internal Medicine

## 2014-03-04 ENCOUNTER — Ambulatory Visit
Admission: RE | Admit: 2014-03-04 | Discharge: 2014-03-04 | Disposition: A | Discharge status: Home / Self Care | Payer: 59 | Source: Ambulatory Visit | Attending: Internal Medicine | Admitting: Internal Medicine

## 2014-03-04 DIAGNOSIS — R7402 Elevation of levels of lactic acid dehydrogenase (LDH): Secondary | ICD-10-CM

## 2014-03-04 DIAGNOSIS — R740 Nonspecific elevation of levels of transaminase and lactic acid dehydrogenase [LDH]: Principal | ICD-10-CM

## 2014-03-04 DIAGNOSIS — R7401 Elevation of levels of liver transaminase levels: Secondary | ICD-10-CM

## 2014-08-12 ENCOUNTER — Other Ambulatory Visit: Payer: Self-pay | Admitting: Internal Medicine

## 2015-04-09 ENCOUNTER — Telehealth: Payer: Self-pay | Admitting: Internal Medicine

## 2015-04-09 ENCOUNTER — Other Ambulatory Visit: Payer: Self-pay

## 2015-04-09 MED ORDER — DEXLANSOPRAZOLE 60 MG PO CPDR: 1.0000 | DELAYED_RELEASE_CAPSULE | Freq: Every day | ORAL | Status: DC

## 2015-04-09 MED ORDER — OLMESARTAN MEDOXOMIL 40 MG PO TABS: 40.0000 mg | ORAL_TABLET | Freq: Every day | ORAL | Status: DC

## 2015-04-09 NOTE — Telephone Encounter (Signed)
Pt's insurance has changed their policy on their prescriptions and they are needing 90 day supply prescriptions for DEXILANT 60 MG capsule [[id number] and BENICAR 40 MG tablet [[id number] Pharmacy is [facility] on [address] [place]

## 2015-04-09 NOTE — Telephone Encounter (Signed)
90 day supply sent. Noted overdue for yearly.

## 2015-07-14 ENCOUNTER — Other Ambulatory Visit: Payer: Self-pay | Admitting: Internal Medicine

## 2015-07-21 ENCOUNTER — Other Ambulatory Visit (INDEPENDENT_AMBULATORY_CARE_PROVIDER_SITE_OTHER): Payer: BLUE CROSS/BLUE SHIELD

## 2015-07-21 ENCOUNTER — Encounter: Payer: Self-pay | Admitting: Internal Medicine

## 2015-07-21 ENCOUNTER — Ambulatory Visit (INDEPENDENT_AMBULATORY_CARE_PROVIDER_SITE_OTHER): Payer: BLUE CROSS/BLUE SHIELD | Admitting: Internal Medicine

## 2015-07-21 ENCOUNTER — Other Ambulatory Visit: Payer: Self-pay | Admitting: Internal Medicine

## 2015-07-21 VITALS — BP 120/84 | HR 85 | Temp 98.6°F | Resp 16 | Ht 73.0 in | Wt 255.0 lb

## 2015-07-21 DIAGNOSIS — R946 Abnormal results of thyroid function studies: Secondary | ICD-10-CM | POA: Diagnosis not present

## 2015-07-21 DIAGNOSIS — Z0000 Encounter for general adult medical examination without abnormal findings: Secondary | ICD-10-CM

## 2015-07-21 DIAGNOSIS — R739 Hyperglycemia, unspecified: Secondary | ICD-10-CM

## 2015-07-21 DIAGNOSIS — R7989 Other specified abnormal findings of blood chemistry: Secondary | ICD-10-CM | POA: Insufficient documentation

## 2015-07-21 DIAGNOSIS — L301 Dyshidrosis [pompholyx]: Secondary | ICD-10-CM

## 2015-07-21 LAB — LIPID PANEL
CHOL/HDL RATIO: 5
CHOLESTEROL: 234 mg/dL — AB (ref 0–200)
HDL: 42.7 mg/dL (ref >39.00)
LDL Cholesterol: 173 mg/dL — ABNORMAL HIGH (ref 0–99)
NonHDL: 190.95
Triglycerides: 90 mg/dL (ref 0.0–149.0)
VLDL: 18 mg/dL (ref 0.0–40.0)

## 2015-07-21 LAB — URINALYSIS, ROUTINE W REFLEX MICROSCOPIC
Bilirubin Urine: NEGATIVE
Hgb urine dipstick: NEGATIVE
KETONES UR: NEGATIVE
Leukocytes, UA: NEGATIVE
NITRITE: NEGATIVE
RBC / HPF: NONE SEEN (ref 0–?)
Specific Gravity, Urine: 1.01 (ref 1.000–1.030)
Total Protein, Urine: NEGATIVE
URINE GLUCOSE: NEGATIVE
UROBILINOGEN UA: 0.2 (ref 0.0–1.0)
WBC UA: NONE SEEN (ref 0–?)
pH: 7 (ref 5.0–8.0)

## 2015-07-21 LAB — COMPREHENSIVE METABOLIC PANEL
ALT: 52 U/L (ref 0–53)
AST: 33 U/L (ref 0–37)
Albumin: 4.5 g/dL (ref 3.5–5.2)
Alkaline Phosphatase: 44 U/L (ref 39–117)
BUN: 13 mg/dL (ref 6–23)
CHLORIDE: 103 meq/L (ref 96–112)
CO2: 29 mEq/L (ref 19–32)
Calcium: 10.1 mg/dL (ref 8.4–10.5)
Creatinine, Ser: 1.01 mg/dL (ref 0.40–1.50)
GFR: 84.84 mL/min (ref >60.00)
GLUCOSE: 95 mg/dL (ref 70–99)
Potassium: 4.5 mEq/L (ref 3.5–5.1)
SODIUM: 140 meq/L (ref 135–145)
Total Bilirubin: 0.4 mg/dL (ref 0.2–1.2)
Total Protein: 7.3 g/dL (ref 6.0–8.3)

## 2015-07-21 LAB — CBC WITH DIFFERENTIAL/PLATELET
BASOS ABS: 0 10*3/uL (ref 0.0–0.1)
Basophils Relative: 0.5 % (ref 0.0–3.0)
EOS ABS: 0.3 10*3/uL (ref 0.0–0.7)
Eosinophils Relative: 4.3 % (ref 0.0–5.0)
HCT: 42.8 % (ref 39.0–52.0)
HEMOGLOBIN: 14.5 g/dL (ref 13.0–17.0)
LYMPHS PCT: 39.9 % (ref 12.0–46.0)
Lymphs Abs: 2.8 10*3/uL (ref 0.7–4.0)
MCHC: 34 g/dL (ref 30.0–36.0)
MCV: 91.8 fl (ref 78.0–100.0)
Monocytes Absolute: 0.7 10*3/uL (ref 0.1–1.0)
Monocytes Relative: 9.8 % (ref 3.0–12.0)
NEUTROS PCT: 45.5 % (ref 43.0–77.0)
Neutro Abs: 3.2 10*3/uL (ref 1.4–7.7)
Platelets: 208 10*3/uL (ref 150.0–400.0)
RBC: 4.66 Mil/uL (ref 4.22–5.81)
RDW: 12.3 % (ref 11.5–15.5)
WBC: 7 10*3/uL (ref 4.0–10.5)

## 2015-07-21 LAB — TSH: TSH: 4.33 u[IU]/mL (ref 0.35–4.50)

## 2015-07-21 LAB — T3 UPTAKE: T3 Uptake: 31 % (ref 22–35)

## 2015-07-21 LAB — T4, FREE: Free T4: 0.94 ng/dL (ref 0.60–1.60)

## 2015-07-21 LAB — HEMOGLOBIN A1C: Hgb A1c MFr Bld: 5.2 % (ref 4.6–6.5)

## 2015-07-21 LAB — T3: T3, Total: 100 ng/dL (ref 76–181)

## 2015-07-21 MED ORDER — FLUOCINONIDE-E 0.05 % EX CREA: 1.0000 "application " | TOPICAL_CREAM | Freq: Two times a day (BID) | CUTANEOUS | Status: DC

## 2015-07-21 NOTE — Progress Notes (Signed)
Pre visit review using our clinic review tool, if applicable. No additional management support is needed unless otherwise documented below in the visit note. 

## 2015-07-21 NOTE — Patient Instructions (Signed)

## 2015-07-21 NOTE — Progress Notes (Signed)
Subjective:  Patient ID: Bobby Rodriguez, male    DOB: [birth year]  Age: 45 y.o. MRN: [id number]  CC: Hypertension; Annual Exam; and Rash   HPI Bobby Rodriguez presents for a CPX - he complains of a dry/itchy rash on his RLE for several months, he has not treated it in any way. He also needs a follow-up on his abnormal thyroid tests. He previously had an elevated TSH, his only suspicious symptom is weight gain.  Outpatient Prescriptions Prior to Visit  Medication Sig Dispense Refill  . DEXILANT 60 MG capsule TAKE ONE CAPSULE BY MOUTH EVERY DAY 90 capsule 1  . dexlansoprazole (DEXILANT) 60 MG capsule Take 1 capsule (60 mg total) by mouth daily. 90 capsule 0  . olmesartan (BENICAR) 40 MG tablet Take 1 tablet (40 mg total) by mouth daily. 90 tablet 0   No facility-administered medications prior to visit.    ROS Review of Systems  Constitutional: Positive for unexpected weight change (some wt gain). Negative for fever, chills, diaphoresis, appetite change and fatigue.  HENT: Negative.   Eyes: Negative.   Respiratory: Negative.  Negative for cough, chest tightness and shortness of breath.   Cardiovascular: Negative.  Negative for chest pain, palpitations and leg swelling.  Gastrointestinal: Negative.  Negative for nausea, vomiting, abdominal pain, diarrhea, constipation and blood in stool.  Endocrine: Negative.   Genitourinary: Negative.   Musculoskeletal: Negative.  Negative for back pain and arthralgias.  Skin: Positive for rash.  Allergic/Immunologic: Negative.   Neurological: Negative.   Hematological: Negative.  Negative for adenopathy. Does not bruise/bleed easily.  Psychiatric/Behavioral: Negative.     Objective:  BP 120/84 mmHg  Pulse 85  Temp(Src) 98.6 F (37 C) (Oral)  Resp 16  Ht 6\' 1"  (1.854 m)  Wt 255 lb (115.667 kg)  [name] 33.65 kg/m2  SpO2 96%  BP Readings from Last 3 Encounters:  07/21/15 120/84  02/22/14 124/80  04/30/13 118/88    Wt Readings from Last  3 Encounters:  07/21/15 255 lb (115.667 kg)  02/22/14 249 lb (112.946 kg)  04/30/13 239 lb (108.41 kg)    Physical Exam  Constitutional: He appears well-developed and well-nourished. No distress.  HENT:  Head: Normocephalic and atraumatic.  Mouth/Throat: Oropharynx is clear and moist. No oropharyngeal exudate.  Eyes: Conjunctivae are normal. Right eye exhibits no discharge. Left eye exhibits no discharge. No scleral icterus.  Neck: Normal range of motion. Neck supple. No JVD present. No tracheal deviation present. No thyromegaly present.  Cardiovascular: Normal rate, regular rhythm, normal heart sounds and intact distal pulses.  Exam reveals no gallop and no friction rub.   No murmur heard. Pulmonary/Chest: Effort normal and breath sounds normal. No stridor. No respiratory distress. He has no wheezes. He has no rales. He exhibits no tenderness.  Abdominal: Soft. Bowel sounds are normal. He exhibits no distension and no mass. There is no tenderness. There is no rebound and no guarding. Hernia confirmed negative in the right inguinal area and confirmed negative in the left inguinal area.  Genitourinary: Testes normal and penis normal. Right testis shows no mass, no swelling and no tenderness. Right testis is descended. Left testis shows no mass, no swelling and no tenderness. Left testis is descended. Circumcised. No penile erythema or penile tenderness. No discharge found.  Musculoskeletal: Normal range of motion. He exhibits no edema or tenderness.  Lymphadenopathy:    He has no cervical adenopathy.       Right: No inguinal adenopathy present.  Left: No inguinal adenopathy present.  Skin: Skin is warm and dry. Rash noted. No purpura noted. Rash is not macular, not papular, not maculopapular, not nodular, not pustular, not vesicular and not urticarial. He is not diaphoretic. No erythema. No pallor.     Psychiatric: He has a normal mood and affect. His behavior is normal. Judgment and  thought content normal.  Vitals reviewed.   Lab Results  Component Value Date   WBC 7.0 07/21/2015   HGB 14.5 07/21/2015   HCT 42.8 07/21/2015   PLT 208.0 07/21/2015   GLUCOSE 95 07/21/2015   CHOL 234* 07/21/2015   TRIG 90.0 07/21/2015   HDL 42.70 07/21/2015   LDLDIRECT 148.8 12/28/2012   LDLCALC 173* 07/21/2015   ALT 52 07/21/2015   AST 33 07/21/2015   NA 140 07/21/2015   K 4.5 07/21/2015   CL 103 07/21/2015   CREATININE 1.01 07/21/2015   BUN 13 07/21/2015   CO2 29 07/21/2015   TSH 4.33 07/21/2015   HGBA1C 5.2 07/21/2015    US Abdomen Complete  03/04/2014  CLINICAL DATA:  Abnormal LFTs. EXAM: ULTRASOUND ABDOMEN COMPLETE COMPARISON:  None. FINDINGS: Gallbladder: No gallstones or wall thickening visualized. No sonographic Murphy sign noted. Common bile duct: Diameter: Normal caliber, 2 mm Liver: Increased echotexture throughout the liver compatible with fatty infiltration. No visible focal abnormality or biliary duct dilatation. IVC: No abnormality visualized. Pancreas: Not well visualized due to overlying bowel gas. Spleen: Size and appearance within normal limits. Right Kidney: Length: 10.5 cm. Echogenicity within normal limits. No mass or hydronephrosis visualized. Left Kidney: Length: 11.5 cm. Echogenicity within normal limits. No mass or hydronephrosis visualized. Abdominal aorta: No aneurysm visualized. Other findings: None. IMPRESSION: Fatty infiltration of the liver. Electronically Signed   By: Charlett Nose M.D.   On: 03/04/2014 10:22    Assessment & Plan:   Bobby Rodriguez was seen today for hypertension, annual exam and rash.  Diagnoses and all orders for this visit:  Hyperglycemia- he has very mild prediabetes, no medications are needed at this time, he will continue to work on his lifestyle modifications. -     Hemoglobin A1c; Future  Routine general medical examination at a health care facility- exam completed, labs ordered and reviewed, his vaccines were reviewed and  updated, he was given patient education material. -     TSH; Future -     Urinalysis, Routine w reflex microscopic (not at [facility]); Future -     Lipid panel; Future -     CBC with Differential/Platelet; Future -     Comprehensive metabolic panel; Future  TSH elevation- his TSH is very mildly elevated but is within the normal range, his TPO antibodies are negative and his T3 and T4 are normal, will continue to observe for now but at this time I don't think he needs thyroid replacement therapy. -     T4, free; Future -     Thyroid peroxidase antibody; Future -     T3 uptake; Future -     T3; Future  Eczema, dyshidrotic- have asked him to improve his moisturization of his scan and will treat with a topical steroid. -     fluocinonide-emollient (LIDEX-E) 0.05 % cream; Apply 1 application topically 2 (two) times daily.   I have discontinued Mr. Neisler's DEXILANT. I am also having him start on fluocinonide-emollient.  Meds ordered this encounter  Medications  . fluocinonide-emollient (LIDEX-E) 0.05 % cream    Sig: Apply 1 application topically 2 (two) times  daily.    Dispense:  60 g    Refill:  2     Follow-up: Return in about 6 months (around 01/18/2016).  Sanda Linger, MD

## 2015-07-22 ENCOUNTER — Encounter: Payer: Self-pay | Admitting: Internal Medicine

## 2015-07-22 LAB — THYROID PEROXIDASE ANTIBODY: Thyroperoxidase Ab SerPl-aCnc: <1 IU/mL (ref <9)

## 2015-07-22 MED ORDER — DEXLANSOPRAZOLE 60 MG PO CPDR: 1.0000 | DELAYED_RELEASE_CAPSULE | Freq: Every day | ORAL | Status: DC

## 2015-07-22 MED ORDER — OLMESARTAN MEDOXOMIL 40 MG PO TABS: 40.0000 mg | ORAL_TABLET | Freq: Every day | ORAL | Status: DC

## 2016-01-04 IMAGING — US US ABDOMEN COMPLETE
1 series · 14 of 25 positions shown · non-contrast
Comparison: None.

CLINICAL DATA: Abnormal LFTs.

EXAM:
ULTRASOUND ABDOMEN COMPLETE

[Series 1: us abdomen complete · 0.32mm/px · 14 of 72 slices shown]
[im 1/72]
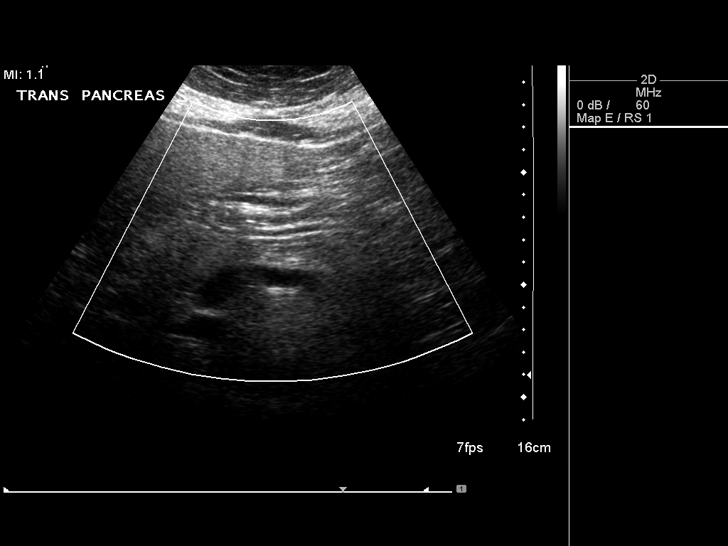
[im 6/72]
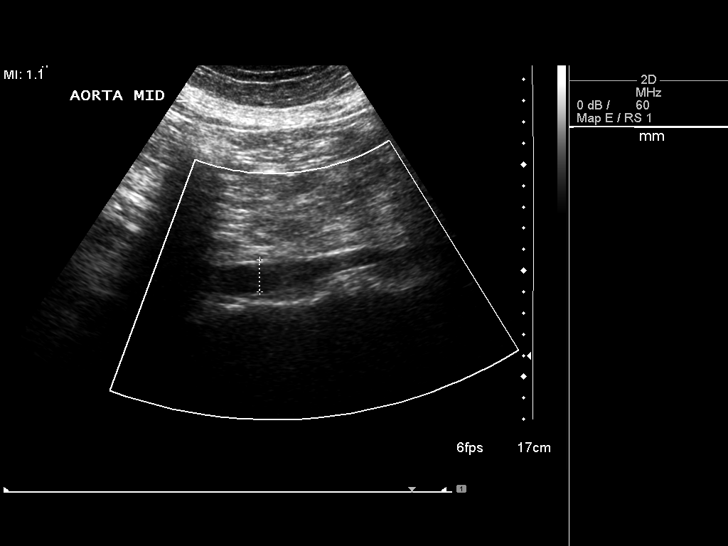
[im 12/72]
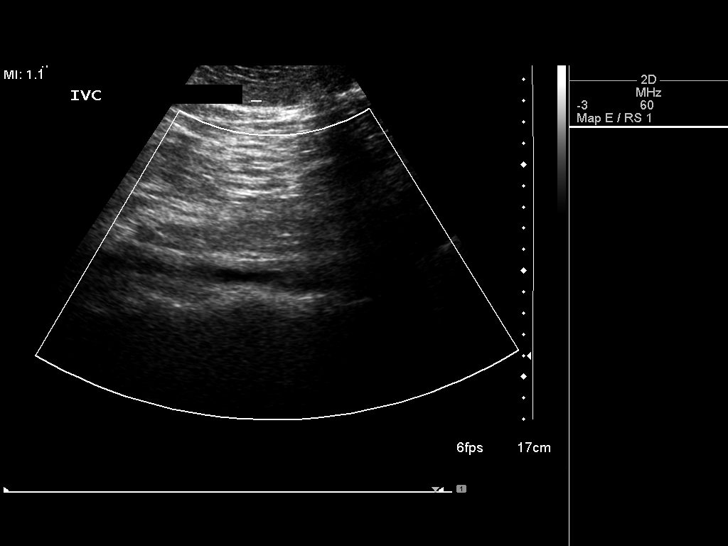
[im 18/72]
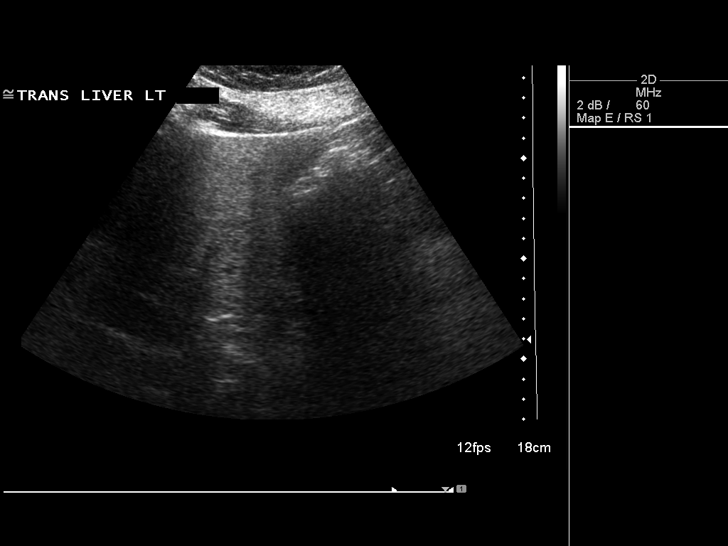
[im 24/72]
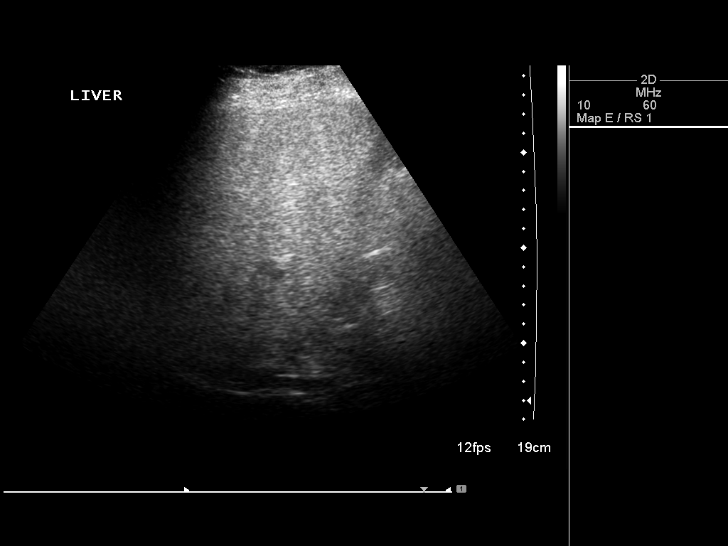
[im 27/72]
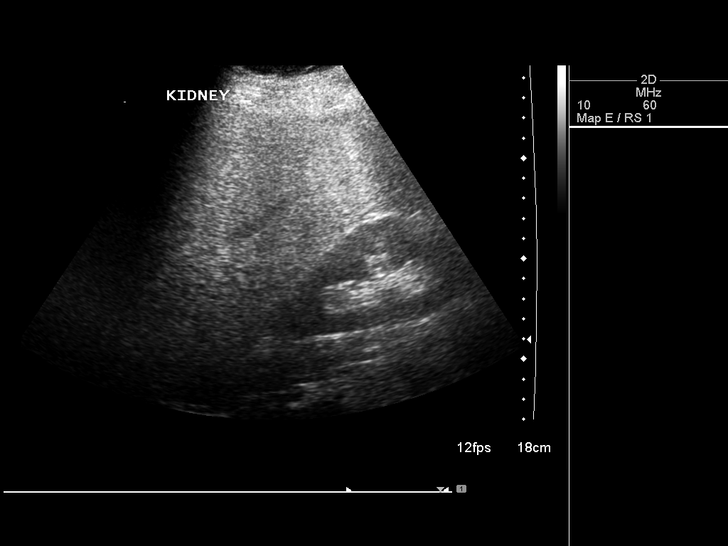
[im 33/72]
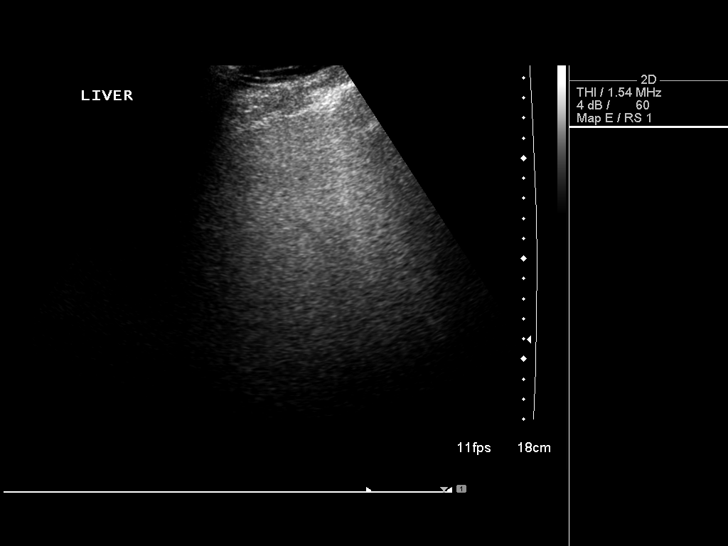
[im 39/72]
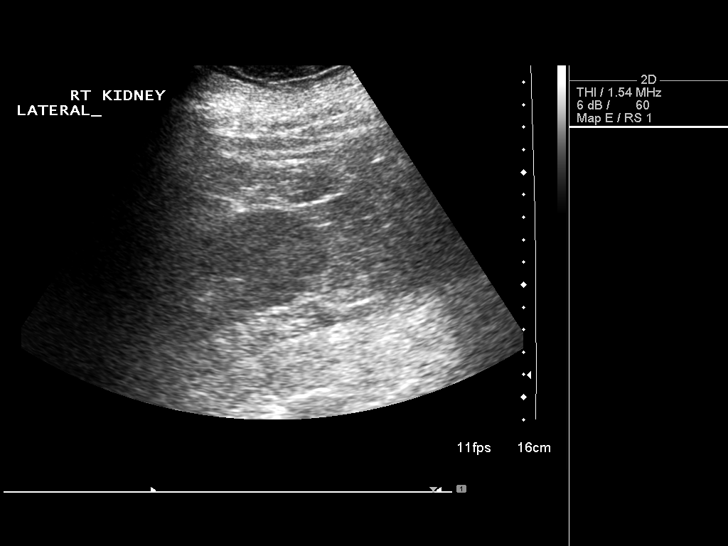
[im 45/72]
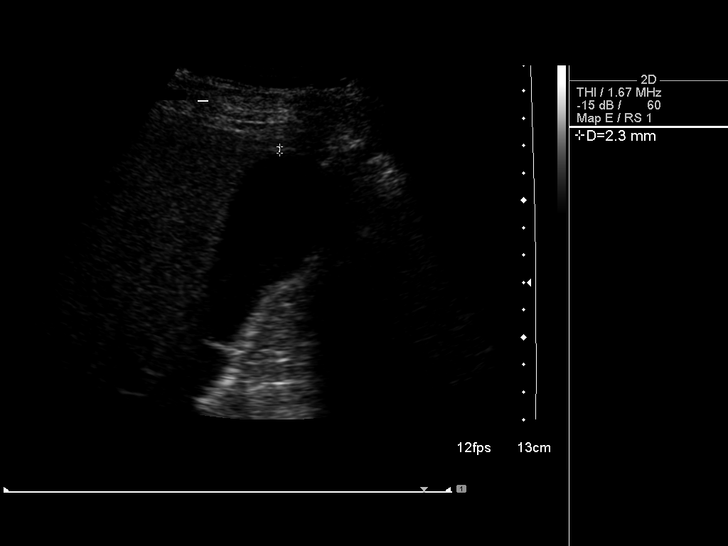
[im 48/72]
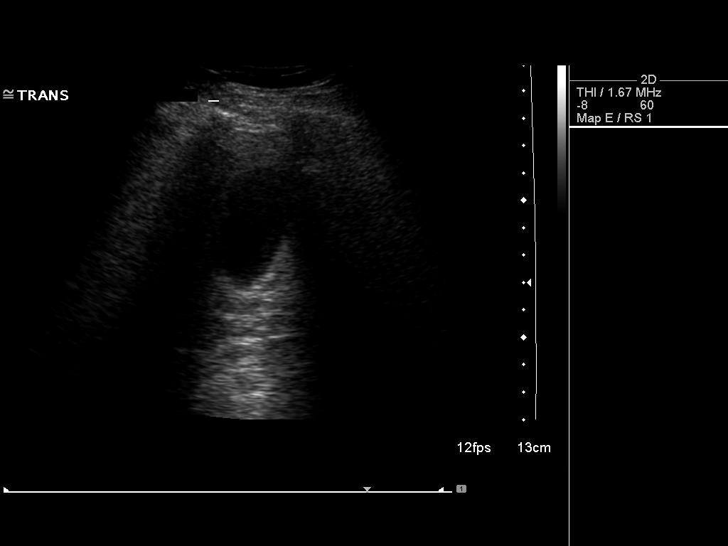
[im 54/72]
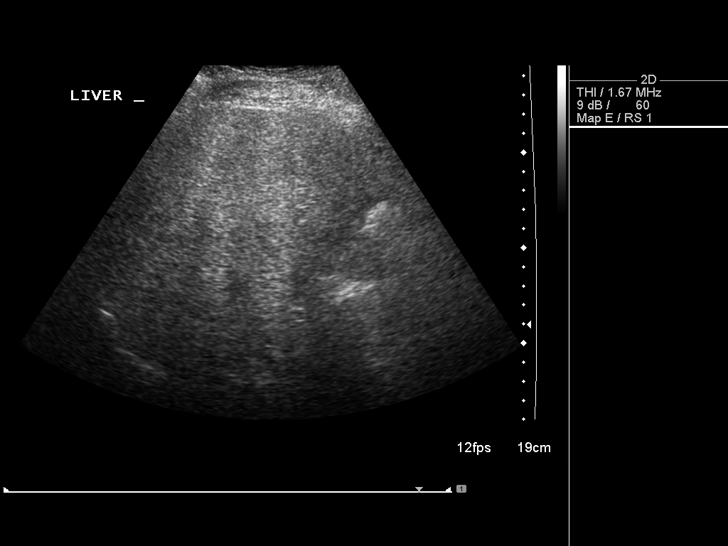
[im 60/72]
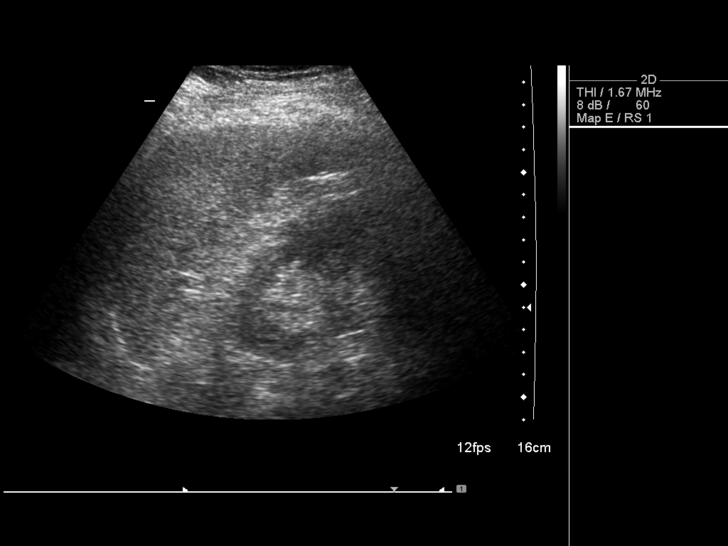
[im 66/72]
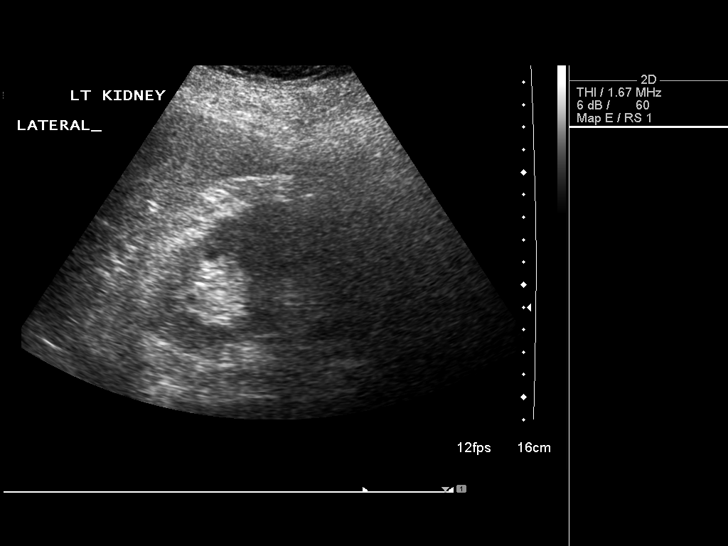
[im 72/72]
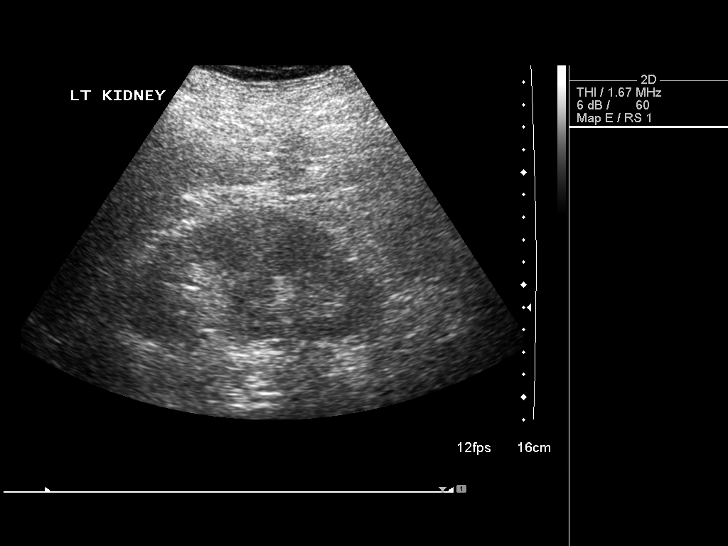

[14 of 25 positions shown; findings below may reference images not displayed]

FINDINGS: Gallbladder:

No gallstones or wall thickening visualized. No sonographic Murphy
sign noted.

Common bile duct:

Diameter: Normal caliber, 2 mm

Liver:

Increased echotexture throughout the liver compatible with fatty
infiltration. No visible focal abnormality or biliary duct
dilatation.

IVC:

No abnormality visualized.

Pancreas:

Not well visualized due to overlying bowel gas.

Spleen:

Size and appearance within normal limits.

Right Kidney:

Length: 10.5 cm. Echogenicity within normal limits. No mass or
hydronephrosis visualized.

Left Kidney:

Length: 11.5 cm. Echogenicity within normal limits. No mass or
hydronephrosis visualized.

Abdominal aorta:

No aneurysm visualized.

Other findings:

None.
IMPRESSION: Fatty infiltration of the liver.

## 2016-07-10 ENCOUNTER — Other Ambulatory Visit: Payer: Self-pay | Admitting: Internal Medicine

## 2016-07-12 ENCOUNTER — Other Ambulatory Visit: Payer: Self-pay | Admitting: Internal Medicine

## 2016-08-14 ENCOUNTER — Other Ambulatory Visit: Payer: Self-pay | Admitting: Internal Medicine

## 2016-08-16 ENCOUNTER — Other Ambulatory Visit: Payer: Self-pay | Admitting: Internal Medicine

## 2017-02-21 ENCOUNTER — Emergency Department (HOSPITAL_COMMUNITY): Payer: BLUE CROSS/BLUE SHIELD

## 2017-02-21 ENCOUNTER — Emergency Department (HOSPITAL_COMMUNITY)
Admission: EM | Admit: 2017-02-21 | Discharge: 2017-02-21 | Disposition: A | Discharge status: Home / Self Care | Payer: BLUE CROSS/BLUE SHIELD | Attending: Emergency Medicine | Admitting: Emergency Medicine

## 2017-02-21 ENCOUNTER — Encounter (HOSPITAL_COMMUNITY): Payer: Self-pay | Admitting: *Deleted

## 2017-02-21 DIAGNOSIS — R0602 Shortness of breath: Secondary | ICD-10-CM | POA: Diagnosis not present

## 2017-02-21 DIAGNOSIS — Z79899 Other long term (current) drug therapy: Secondary | ICD-10-CM | POA: Insufficient documentation

## 2017-02-21 DIAGNOSIS — R079 Chest pain, unspecified: Secondary | ICD-10-CM | POA: Diagnosis not present

## 2017-02-21 DIAGNOSIS — Z87891 Personal history of nicotine dependence: Secondary | ICD-10-CM | POA: Diagnosis not present

## 2017-02-21 DIAGNOSIS — R0789 Other chest pain: Secondary | ICD-10-CM | POA: Diagnosis not present

## 2017-02-21 DIAGNOSIS — Z7982 Long term (current) use of aspirin: Secondary | ICD-10-CM | POA: Diagnosis not present

## 2017-02-21 DIAGNOSIS — I10 Essential (primary) hypertension: Secondary | ICD-10-CM | POA: Diagnosis not present

## 2017-02-21 LAB — BASIC METABOLIC PANEL
ANION GAP: 9 (ref 5–15)
BUN: 13 mg/dL (ref 6–20)
CALCIUM: 9.3 mg/dL (ref 8.9–10.3)
CO2: 27 mmol/L (ref 22–32)
Chloride: 103 mmol/L (ref 101–111)
Creatinine, Ser: 1.2 mg/dL (ref 0.61–1.24)
GFR calc Af Amer: >60 mL/min (ref >60)
GLUCOSE: 117 mg/dL — AB (ref 65–99)
Potassium: 4.5 mmol/L (ref 3.5–5.1)
Sodium: 139 mmol/L (ref 135–145)

## 2017-02-21 LAB — CBC
HCT: 43.5 % (ref 39.0–52.0)
HEMOGLOBIN: 15.2 g/dL (ref 13.0–17.0)
MCH: 31.3 pg (ref 26.0–34.0)
MCHC: 34.9 g/dL (ref 30.0–36.0)
MCV: 89.5 fL (ref 78.0–100.0)
Platelets: 186 10*3/uL (ref 150–400)
RBC: 4.86 MIL/uL (ref 4.22–5.81)
RDW: 12 % (ref 11.5–15.5)
WBC: 6.3 10*3/uL (ref 4.0–10.5)

## 2017-02-21 LAB — I-STAT TROPONIN, ED
TROPONIN I, POC: 0 ng/mL (ref 0.00–0.08)
TROPONIN I, POC: 0 ng/mL (ref 0.00–0.08)

## 2017-02-21 MED ORDER — ASPIRIN EC 81 MG PO TBEC: 81.0000 mg | DELAYED_RELEASE_TABLET | Freq: Every day | ORAL | 0 refills | Status: DC

## 2017-02-21 NOTE — ED Notes (Signed)
ED Provider at bedside. 

## 2017-02-21 NOTE — Discharge Instructions (Signed)
Return for worsening symptoms. Avoid activities that give you increased chest pain.

## 2017-02-21 NOTE — ED Provider Notes (Signed)
[facility]-EMER[facility]ENCY DEPT Provider Note   CSN: [id number] Arrival date & time: 02/21/17  1116     History   Chief Complaint Chief Complaint  Patient presents with  . Chest Pain    HPI Bobby Rodriguez is a 46 y.o. male.  HPI Patient presents with chest pain. His pressure in his mid chest. Comes and goes. First episode was around 2 months ago. Comes and goes whenever wants to but also seems to come on with exertion at times. Sometimes will be social with some sweating and sometimes does not. States he has had reflux in the past thoughts that what it is. No fevers. No swelling in his legs. No nausea. Has a primary care doctor he sees. History of high cholesterol and hypertension. He is prediabetic. No previous stress test. Does not smoke. No early cardiac disease in his family. Past Medical History:  Diagnosis Date  . Esophageal stricture   . GERD (gastroesophageal reflux disease)   . Hypertension     Patient Active Problem List   Diagnosis Date Noted  . Hyperglycemia 07/21/2015  . TSH elevation 07/21/2015  . Eczema, dyshidrotic 07/21/2015  . Other abnormal glucose 02/22/2014  . Snoring 02/22/2013  . Obesity ([name] 30.0-34.9) 02/21/2013  . Fatty liver disease, nonalcoholic 02/20/2013  . Essential hypertension, benign 12/28/2012  . GERD (gastroesophageal reflux disease) 12/28/2012  . Esophageal stricture 12/28/2012  . Routine general medical examination at a health care facility 12/28/2012    Past Surgical History:  Procedure Laterality Date  . WISDOM TOOTH EXTRACTION         Home Medications    Prior to Admission medications   Medication Sig Start Date End Date Taking? Authorizing Provider  aspirin EC 81 MG tablet Take 1 tablet (81 mg total) by mouth daily. 02/21/17   Benjiman CorePickering, Alaisa Moffitt, MD  DEXILANT 60 MG capsule TAKE 1 CAPSULE (60 MG TOTAL) BY MOUTH DAILY. 07/10/16   Etta GrandchildJones, Thomas L, MD  fluocinonide-emollient (LIDEX-E) 0.05 % cream Apply 1 application topically 2 (two)  times daily. 07/21/15   Etta GrandchildJones, Thomas L, MD  olmesartan (BENICAR) 40 MG tablet TAKE 1 TABLET (40 MG TOTAL) BY MOUTH DAILY. 07/10/16   Etta GrandchildJones, Thomas L, MD    Family History Family History  Problem Relation Age of Onset  . Hypertension Father   . Ulcers Father   . Ulcers Sister   . Stroke Neg Hx   . Kidney disease Neg Hx   . Cancer Neg Hx   . Diabetes Neg Hx   . Early death Neg Hx   . Hearing loss Neg Hx   . Heart disease Neg Hx   . Hyperlipidemia Neg Hx     Social History Social History  Substance Use Topics  . Smoking status: Never Smoker  . Smokeless tobacco: Former NeurosurgeonUser    Quit date: 06/15/2007  . Alcohol use No     Comment: 1-2 drinks per day     Allergies   Patient has no known allergies.   Review of Systems Review of Systems  Constitutional: Positive for diaphoresis. Negative for appetite change.  HENT: Negative for congestion.   Respiratory: Positive for shortness of breath.   Cardiovascular: Positive for chest pain.  Gastrointestinal: Negative for abdominal pain.  Genitourinary: Negative for flank pain.  Musculoskeletal: Negative for back pain.  Neurological: Negative for weakness.  Hematological: Negative for adenopathy.  Psychiatric/Behavioral: Negative for confusion.     Physical Exam Updated Vital Signs BP 139/85   Pulse 71  Temp 97.7 F (36.5 C) (Oral)   Resp 16   SpO2 100%   Physical Exam  Constitutional: He appears well-developed.  HENT:  Head: Atraumatic.  Neck: Neck supple.  Cardiovascular: Normal rate.   Pulmonary/Chest: Effort normal. He has no wheezes. He has no rales. He exhibits no tenderness.  Abdominal: Soft. There is no tenderness.  Musculoskeletal: He exhibits no edema.  Neurological: He is alert.  Skin: Skin is warm.     ED Treatments / Results  Labs (all labs ordered are listed, but only abnormal results are displayed) Labs Reviewed  BASIC METABOLIC PANEL - Abnormal; Notable for the following:       Result Value    Glucose, Bld 117 (*)    All other components within normal limits  CBC  I-STAT TROPONIN, ED  I-STAT TROPONIN, ED    EKG  EKG Interpretation  Date/Time:  Monday February 21 2017 11:28:41 EDT Ventricular Rate:  82 PR Interval:  120 QRS Duration: 100 QT Interval:  378 QTC Calculation: 441 R Axis:   76 Text Interpretation:  Sinus rhythm with Premature atrial complexes Incomplete right bundle branch block Borderline ECG Confirmed by Benjiman Core [phone]) on 02/21/2017 5:20:11 PM       Radiology Dg Chest 2 View  Result Date: 02/21/2017 CLINICAL DATA:  Shortness of breath and chest discomfort EXAM: CHEST  2 VIEW COMPARISON:  None. FINDINGS: Lungs are clear. Heart size and pulmonary vascularity are normal. No adenopathy. No pneumothorax. No bone lesions. IMPRESSION: No edema or consolidation. Electronically Signed   By: Bretta Bang III M.D.   On: 02/21/2017 12:07    Procedures Procedures (including critical care time)  Medications Ordered in ED Medications - No data to display   Initial Impression / Assessment and Plan / ED Course  I have reviewed the triage vital signs and the nursing notes.  Pertinent labs & imaging results that were available during my care of the patient were reviewed by me and considered in my medical decision making (see chart for details).     Patient with chest pain. Moderate risk story. EKG reassuring and enzymes negative 2. Discussed with Suzzette Righter from cardiology. The office will fall for short-term follow-up. Discharge home.  Final Clinical Impressions(s) / ED Diagnoses   Final diagnoses:  Chest pain, unspecified type    New Prescriptions Discharge Medication List as of 02/21/2017  6:11 PM    START taking these medications   Details  aspirin EC 81 MG tablet Take 1 tablet (81 mg total) by mouth daily., Starting Mon 02/21/2017, Print         Benjiman Core, MD 02/21/17 [phone]

## 2017-02-21 NOTE — ED Triage Notes (Signed)
PT states his overall health is different than his normal and states he tires easily, shortness of breath with stairs, but can maintain.  PT reports mid center chest pressure without radiation and states at sometimes gets diaphoretic. Constant pressure since 0700

## 2017-02-23 ENCOUNTER — Encounter: Payer: Self-pay | Admitting: Cardiology

## 2017-02-23 ENCOUNTER — Ambulatory Visit (INDEPENDENT_AMBULATORY_CARE_PROVIDER_SITE_OTHER): Payer: BLUE CROSS/BLUE SHIELD | Admitting: Cardiology

## 2017-02-23 VITALS — BP 138/88 | HR 74 | Ht 72.0 in | Wt 241.8 lb

## 2017-02-23 DIAGNOSIS — E669 Obesity, unspecified: Secondary | ICD-10-CM | POA: Diagnosis not present

## 2017-02-23 DIAGNOSIS — I200 Unstable angina: Secondary | ICD-10-CM

## 2017-02-23 DIAGNOSIS — R739 Hyperglycemia, unspecified: Secondary | ICD-10-CM

## 2017-02-23 DIAGNOSIS — I10 Essential (primary) hypertension: Secondary | ICD-10-CM | POA: Diagnosis not present

## 2017-02-23 DIAGNOSIS — R0683 Snoring: Secondary | ICD-10-CM

## 2017-02-23 DIAGNOSIS — E785 Hyperlipidemia, unspecified: Secondary | ICD-10-CM | POA: Diagnosis not present

## 2017-02-23 DIAGNOSIS — Z01818 Encounter for other preprocedural examination: Secondary | ICD-10-CM | POA: Diagnosis not present

## 2017-02-23 DIAGNOSIS — R0789 Other chest pain: Secondary | ICD-10-CM

## 2017-02-23 DIAGNOSIS — D689 Coagulation defect, unspecified: Secondary | ICD-10-CM

## 2017-02-23 LAB — CBC
HEMOGLOBIN: 14.9 g/dL (ref 13.0–17.7)
Hematocrit: 42.3 % (ref 37.5–51.0)
MCH: 31.8 pg (ref 26.6–33.0)
MCHC: 35.2 g/dL (ref 31.5–35.7)
MCV: 90 fL (ref 79–97)
Platelets: 210 10*3/uL (ref 150–379)
RBC: 4.68 x10E6/uL (ref 4.14–5.80)
RDW: 12.9 % (ref 12.3–15.4)
WBC: 6.5 10*3/uL (ref 3.4–10.8)

## 2017-02-23 LAB — BASIC METABOLIC PANEL
BUN/Creatinine Ratio: 11 (ref 9–20)
BUN: 14 mg/dL (ref 6–24)
CO2: 27 mmol/L (ref 20–29)
Calcium: 9.5 mg/dL (ref 8.7–10.2)
Chloride: 100 mmol/L (ref 96–106)
Creatinine, Ser: 1.22 mg/dL (ref 0.76–1.27)
GFR calc Af Amer: 82 mL/min/{1.73_m2} (ref >59)
GFR calc non Af Amer: 71 mL/min/{1.73_m2} (ref >59)
GLUCOSE: 101 mg/dL — AB (ref 65–99)
POTASSIUM: 5 mmol/L (ref 3.5–5.2)
SODIUM: 140 mmol/L (ref 134–144)

## 2017-02-23 LAB — PROTIME-INR
INR: 1 (ref 0.8–1.2)
PROTHROMBIN TIME: 10.5 s (ref 9.1–12.0)

## 2017-02-23 MED ORDER — ISOSORBIDE MONONITRATE ER 30 MG PO TB24: 30.0000 mg | ORAL_TABLET | Freq: Every day | ORAL | 6 refills | Status: DC

## 2017-02-23 MED ORDER — CARVEDILOL 3.125 MG PO TABS: 3.1250 mg | ORAL_TABLET | Freq: Two times a day (BID) | ORAL | 6 refills | Status: DC

## 2017-02-23 MED ORDER — NITROGLYCERIN 0.4 MG SL SUBL: 0.4000 mg | SUBLINGUAL_TABLET | SUBLINGUAL | 6 refills | Status: DC | PRN

## 2017-02-23 MED ORDER — ROSUVASTATIN CALCIUM 40 MG PO TABS: 40.0000 mg | ORAL_TABLET | Freq: Every day | ORAL | 6 refills | Status: DC

## 2017-02-23 NOTE — Patient Instructions (Signed)
   [facility] [address] [place] [address] Dept: [phone] Loc: [phone]  Bobby FeinsteinConley D Decou  02/23/2017  You are scheduled for a Cardiac Catheterization on Tuesday, September 18 with Dr. Bryan Lemmaavid Harding.  1. Please arrive [facility] ([facility] at [facility]: [address] [place], [place] [address] at 5:00 AM (two hours before your procedure to ensure your preparation). Free valet parking service is available.   Special note: Every effort is made to have your procedure done on time. Please understand that emergencies sometimes delay scheduled procedures.  2. Diet: Do not eat or drink anything after midnight prior to your procedure except sips of water to take medications.  3. Labs: TODAY AT [facility]  CBC  PT  BMP  4. Medication instructions in preparation for your procedure:  On the morning of your procedure, take your Aspirin 81 MG and any morning medicines NOT listed above.  You may use sips of water.  5. Plan for one night stay--bring personal belongings. 6. Bring a current list of your medications and current insurance cards. 7. You MUST have a responsible person to drive you home. 8. Someone MUST be with you the first 24 hours after you arrive home or your discharge will be delayed. 9. Please wear clothes that are easy to get on and off and wear slip-on shoes.  Thank you for allowing us to care for you!   -- [facility]   Your physician recommends that you schedule a follow-up appointment in 2 WEEKS WITH DR HARDING.

## 2017-02-23 NOTE — Progress Notes (Signed)
PCP: Etta Grandchild, MD  Clinic Note: Chief Complaint  Patient presents with  . New Patient (Initial Visit)    exertional shortness of breath and chest pain    HPI: Bobby Rodriguez is a 46 y.o. male with a PMH below who presents today for emergency room follow-up.Wandra Feinstein as seen in emergency room on September 10 for recurrent episodes of left-sided chest discomfort it was intermittent. He did rule out for MI and was discharged home after second set of troponins for cardiology evaluation. - ZOX:WRUEA rhythm, 82 BPM. PACs, inc RBBB; normal axis, intervals and durations. No ischemic ST and T-wave changes.  Recent Hospitalizations: ER visit above  Studies Personally Reviewed - (if available, images/films reviewed: From Epic Chart or Care Everywhere)  None  Interval History: Sewell is a very pleasant young gentleman with a history of hypertension and borderline dyslipidemia along with GERD.  He indicates that he is routinely active and exercises without difficulty, however if the end of June, he had a prolonged 30 minute episode of substernal chest pain while driving in his car.  He said of this symptom was associated with nausea and diffuse sweating. He also noted below but difficulty breathing. He pulled over the side of the road for a few minutes, and the symptom resolved spontaneously. He "blew the whole thing off" at that time stating that he felt it most of an indigestion, however that was not similar to his GERD type symptoms.Since then he's been noticing occasional j"olts" of chest pressure off & on lasting less than a minute - regardless of any activity.  However, in the last few weeks, he is been noticing more frequent episodes of chest pressure that feels like squeezing sensation on his chest. These episodes are also often on, but are lasting longer and associated with shortness of breath but less of the nausea and sweating that he had gone the first episode. He states that  now is having these episodes just about every day. He indicated that he really hasn't been doing much of his usual exercise since June, but when he has, he will note that the symptoms are significantly made worse by "cardio "exercise but not necessarily his strength exercises. When he gets his heart rate up to doing his cardio reps, he now can only goa few minutes without noticing it difficulty breathing feeling the tightness in his chest.  At worst the discomfort in his chest was the very first episode being at least 7/10, but of late is been more in the 3-5/10 range and he describes as a fullness and pressure in the upper part of his chest into his throat. It does not radiate to the arm or back.Most the time it's associated with shortness of breath, and if more prolonged will be associated with nausea and a little bit of sweating.  E is now starting to get quite concerned and is fearful of doing any particular activity without being evaluated. He has had a history of GERD and indicates that none of these symptoms are similar to his GERD symptoms that some more burning sensation in his chest associated with a bitter taste in his mouth and this is nothing like that.  Other cardiac review of symptoms are essentially negative: He denies any heart failure symptoms of PND, orthopnea or edema.  No palpitations, lightheadedness, dizziness, weakness or syncope/near syncope. No TIA/amaurosis fugax symptoms. No melena, hematochezia, hematuria, or epstaxis. No claudication.  ROS: A comprehensive was performed.  Pertinent symptoms noted above   Epworth Sleepiness Scale: Situation   Chance of Dozing/Sleeping (0 = never , 1 = slight chance , 2 = moderate chance , 3 = high chance )   sitting and reading 1   watching TV 2   sitting inactive in a public place 2   being a passenger in a motor vehicle for an hour or more 3   lying down in the afternoon 3   sitting and talking to someone 1   sitting quietly after  lunch (no alcohol) 2   while stopped for a few minutes in traffic as the driver 2   Total Score  16: has HTN, is overweight. Memory/concentration concerns. Awakes not feeling rested & feels excessively tired during the day.   Review of Systems  Constitutional: Negative for malaise/fatigue.  HENT: Negative for congestion and sinus pain.   Eyes: Negative for blurred vision.  Respiratory: Negative for cough, shortness of breath (except with exertion) and wheezing.        See Epworth Score for OSA assessment questionaire  Cardiovascular:       Per history of present illness  Gastrointestinal: Positive for heartburn (actually relativly well-controlled).  Genitourinary: Negative for dysuria.  Musculoskeletal: Negative for joint pain and myalgias.  Neurological: Negative for dizziness.  Psychiatric/Behavioral:       See Epworth Score  All other systems reviewed and are negative.  PAD Screen 02/23/2017  Previous PAD dx? No  Previous surgical procedure? No  Pain with walking? No  Feet/toe relief with dangling? No  Painful, non-healing ulcers? No  Extremities discolored? No    I have reviewed and (if needed) personally updated the patient's problem list, medications, allergies, past medical and surgical history, social and family history.   Past Medical History:  Diagnosis Date  . Esophageal stricture   . GERD (gastroesophageal reflux disease)   . Hypertension     Past Surgical History:  Procedure Laterality Date  . WISDOM TOOTH EXTRACTION      Current Meds  Medication Sig  . aspirin EC 81 MG tablet Take 1 tablet (81 mg total) by mouth daily.  Marland Kitchen. DEXILANT 60 MG capsule TAKE 1 CAPSULE (60 MG TOTAL) BY MOUTH DAILY. (Patient not taking: Reported on 02/25/2017)  . olmesartan (BENICAR) 40 MG tablet TAKE 1 TABLET (40 MG TOTAL) BY MOUTH DAILY. (Patient not taking: Reported on 02/25/2017)  . [DISCONTINUED] fluocinonide-emollient (LIDEX-E) 0.05 % cream Apply 1 application topically 2 (two)  times daily.    No Known Allergies  Social History   Social History  . Marital status: Married    Spouse name: N/A  . Number of children: 1  . Years of education: N/A   Occupational History  . Sales [employer]   Social History Main Topics  . Smoking status: Never Smoker  . Smokeless tobacco: Former NeurosurgeonUser    Quit date: 06/15/2007  . Alcohol use No     Comment: 1-2 drinks per day  . Drug use: No  . Sexual activity: Yes   Other Topics Concern  . None   Social History Narrative  . None    family history includes Hypertension in his father; Ulcers in his father and sister.  Wt Readings from Last 3 Encounters:  02/23/17 241 lb 12.8 oz (109.7 kg)  07/21/15 255 lb (115.7 kg)  02/22/14 249 lb (112.9 kg)    PHYSICAL EXAM BP 138/88   Pulse 74   Ht 6' (1.829 m)   Wt 241  lb 12.8 oz (109.7 kg)   [name] 32.79 kg/m  Physical Exam  Constitutional: He is oriented to person, place, and time. He appears well-developed and well-nourished. No distress.  HENT:  Head: Normocephalic.  Mouth/Throat: Oropharynx is clear and moist. No oropharyngeal exudate.  Eyes: Pupils are equal, round, and reactive to light. Conjunctivae and EOM are normal. No scleral icterus.  Neck: Normal range of motion. Neck supple. No hepatojugular reflux and no JVD present. Carotid bruit is not present. No thyromegaly present.  Cardiovascular: Normal rate, regular rhythm, normal heart sounds and intact distal pulses.   No extrasystoles are present. PMI is not displaced.  Exam reveals no gallop and no friction rub.   No murmur heard. Pulmonary/Chest: Effort normal and breath sounds normal. No respiratory distress. He has no wheezes. He has no rales. He exhibits no tenderness.  Abdominal: Soft. Bowel sounds are normal. He exhibits no distension. There is no tenderness. There is no rebound and no guarding.  Musculoskeletal: Normal range of motion. He exhibits no edema or deformity.  Neurological: He is alert and  oriented to person, place, and time. No cranial nerve deficit.  Skin: Skin is warm and dry. No rash noted. No erythema. No pallor.  Psychiatric: He has a normal mood and affect. His behavior is normal. Judgment and thought content normal.  Nursing note and vitals reviewed.    Adult ECG Report - not checked today  Other studies Reviewed: Additional studies/ records that were reviewed today include:  Recent Labs:   Lab Results  Component Value Date   CREATININE 1.22 02/23/2017   BUN 14 02/23/2017   NA 140 02/23/2017   K 5.0 02/23/2017   CL 100 02/23/2017   CO2 27 02/23/2017   Lab Results  Component Value Date   CHOL 234 (H) 07/21/2015   HDL 42.70 07/21/2015   LDLCALC 173 (H) 07/21/2015   LDLDIRECT 148.8 12/28/2012   TRIG 90.0 07/21/2015   CHOLHDL 5 07/21/2015    ASSESSMENT / PLAN: Problem List Items Addressed This Visit    Dyslipidemia, goal LDL below 70    Last labs I have were from February 2017 his LDL was 173. Clearly not at goal now. Plan: Start Crestor 40 mg daily with concern for progressive angina.      Relevant Medications   nitroGLYCERIN (NITROSTAT) 0.4 MG SL tablet   isosorbide mononitrate (IMDUR) 30 MG 24 hr tablet   carvedilol (COREG) 3.125 MG tablet   rosuvastatin (CRESTOR) 40 MG tablet   Essential hypertension, benign (Chronic)    Borderline pressures today on ARB. He states that he really is only taking the Benicar off and on. With concern for progressive angina, I will start carvedilol 3.125 twice a day      Relevant Medications   nitroGLYCERIN (NITROSTAT) 0.4 MG SL tablet   isosorbide mononitrate (IMDUR) 30 MG 24 hr tablet   carvedilol (COREG) 3.125 MG tablet   rosuvastatin (CRESTOR) 40 MG tablet   Obesity ([name] 30.0-34.9)    He is trying to exercise, but now is unable to because of his symptoms. Hopefully once her clarify his chest pain symptoms we can get him back to his exercises.      Other abnormal glucose   Progressive angina (HCC) -  Primary    He presents today with symptoms that sound to me like progressive angina. Prolonged episode of chest discomfort 2 months ago which had several classic  Features of unstable angina including the substernal pressure radiating into his throat  with nausea and dyspnea and sweating.  He has had progressively worsening symptoms off and on since then - sometimes occurring to a lesser extent with rest if he is anxious.  However there now starting to happen with exertionand regardless her happening just about every day now. His cardiac risk factors include: male, hypertension and prior hyperlipidemia not on medications. He has symptoms suggestive of OSA  He progressive nature of his symptoms is concerning for unstable process, and therefore I feel invasive evaluation cardiac catheterization is warranted.  Plan: Schedule for cardiac catheterization next week  I will start him on low-dose carvedilol as well as Imdur 30 mg daily at bedtime.   He'll start taking baby aspirin every day.   I provided supplemental nitroglycerin.  Will also need to start statin: Crestor 40 mg daily      Relevant Medications   nitroGLYCERIN (NITROSTAT) 0.4 MG SL tablet   isosorbide mononitrate (IMDUR) 30 MG 24 hr tablet   carvedilol (COREG) 3.125 MG tablet   rosuvastatin (CRESTOR) 40 MG tablet   Other Relevant Orders   CBC (Completed)   Basic metabolic panel (Completed)   LEFT AND RIGHT HEART CATHETERIZATION WITH CORONARY ANGIOGRAM   Snoring    In the past there is discussion of potential polysomnogram evaluation, he wanted to lose weight. That was in 2014 still has symptoms. Pending results of his cardiac catheterization, I think the risks of OSA long-term are significant enough that we should probably consider OSA evaluation with polysomnogram.       Other Visit Diagnoses    Pre-op testing       Relevant Orders   CBC (Completed)   Basic metabolic panel (Completed)   Protime-INR (Completed)   LEFT AND  RIGHT HEART CATHETERIZATION WITH CORONARY ANGIOGRAM   Clotting disorder (HCC)       Relevant Orders   Protime-INR (Completed)      Current medicines are reviewed at length with the patient today. (+/- concerns) n/a The following changes have been made: see below  Patient Instructions    [facility] [address] [place] [address] Dept: [phone] Loc: [phone]  GRADYN SHEIN  02/23/2017  You are scheduled for a Cardiac Catheterization on Tuesday, September 18 with Dr. Bryan Lemma.  1. Please arrive at the [facility] (Main Entrance A) at [facility]: [address] [place], [place] [address] at 5:00 AM (two hours before your procedure to ensure your preparation). Free valet parking service is available.   Special note: Every effort is made to have your procedure done on time. Please understand that emergencies sometimes delay scheduled procedures.  2. Diet: Do not eat or drink anything after midnight prior to your procedure except sips of water to take medications.  3. Labs: TODAY AT [facility]  CBC  PT  BMP  4. Medication instructions in preparation for your procedure:  On the morning of your procedure, take your Aspirin 81 MG and any morning medicines NOT listed above.  You may use sips of water.  5. Plan for one night stay--bring personal belongings. 6. Bring a current list of your medications and current insurance cards. 7. You MUST have a responsible person to drive you home. 8. Someone MUST be with you the first 24 hours after you arrive home or your discharge will be delayed. 9. Please wear clothes that are easy to get on and off and wear slip-on shoes.  Thank you  for allowing [place] to care for you!   -- [facility]   Your physician recommends that you schedule a follow-up appointment in 2 WEEKS WITH DR  Islah Eve.     Studies Ordered:   Orders Placed This Encounter  Procedures  . CBC  . Basic metabolic panel  . Protime-INR  . LEFT AND RIGHT HEART CATHETERIZATION WITH CORONARY Jocelyn Lamer, M.D., M.S. Interventional Cardiologist   Pager # [phone] Phone # [phone] [address] [place], [place] [address]

## 2017-02-24 ENCOUNTER — Telehealth: Payer: Self-pay | Admitting: Cardiology

## 2017-02-24 NOTE — Telephone Encounter (Signed)
S/w pt he states that he did not know which mediction was causing his H/A, instructed pt to cut dose in 1/2 and wait for H/A to go away and the titrate back up to the prescribed dose in about 10 days, informed pt to call back in 2 weeks if H/A does not go away. Pt states that he will try this and call back if necessary. Pt expresses thanks.

## 2017-02-24 NOTE — Telephone Encounter (Signed)
New message    Pt c/o medication issue:  1. Name of Medication: isosorbide carvedilol rovusatatin   2. How are you currently taking this medication (dosage and times per day)? daily  3. Are you having a reaction (difficulty breathing--STAT)? headaches  4. What is your medication issue? Pt is calling stating he is having severe headaches on his medication. He wants to know what to do because he feels worse after he takes the medication.

## 2017-02-25 ENCOUNTER — Encounter: Payer: Self-pay | Admitting: Cardiology

## 2017-02-25 NOTE — Assessment & Plan Note (Addendum)
He is trying to exercise, but now is unable to because of his symptoms. Hopefully once her clarify his chest pain symptoms we can get him back to his exercises.

## 2017-02-25 NOTE — Assessment & Plan Note (Signed)
Here is a suggestion of a history of hyperglycemia --> we'll check a hemoglobin A1c along with his baseline precath labs  If true, would add to cardiac risk factors making up metabolic syndrome.

## 2017-02-25 NOTE — Assessment & Plan Note (Signed)
He presents today with symptoms that sound to me like progressive angina. Prolonged episode of chest discomfort 2 months ago which had several classic  Features of unstable angina including the substernal pressure radiating into his throat with nausea and dyspnea and sweating.  He has had progressively worsening symptoms off and on since then - sometimes occurring to a lesser extent with rest if he is anxious.  However there now starting to happen with exertionand regardless her happening just about every day now. His cardiac risk factors include: male, hypertension and prior hyperlipidemia not on medications. He has symptoms suggestive of OSA  He progressive nature of his symptoms is concerning for unstable process, and therefore I feel invasive evaluation cardiac catheterization is warranted.  Plan: Schedule for cardiac catheterization next week  I will start him on low-dose carvedilol as well as Imdur 30 mg daily at bedtime.   He'll start taking baby aspirin every day.   I provided supplemental nitroglycerin.  Will also need to start statin: Crestor 40 mg daily

## 2017-02-25 NOTE — Assessment & Plan Note (Signed)
Borderline pressures today on ARB. He states that he really is only taking the Benicar off and on. With concern for progressive angina, I will start carvedilol 3.125 twice a day

## 2017-02-26 DIAGNOSIS — E785 Hyperlipidemia, unspecified: Secondary | ICD-10-CM | POA: Insufficient documentation

## 2017-02-26 NOTE — Assessment & Plan Note (Signed)
In the past there is discussion of potential polysomnogram evaluation, he wanted to lose weight. That was in 2014 still has symptoms. Pending results of his cardiac catheterization, I think the risks of OSA long-term are significant enough that we should probably consider OSA evaluation with polysomnogram.

## 2017-02-26 NOTE — Assessment & Plan Note (Signed)
Last labs I have were from February 2017 his LDL was 173. Clearly not at goal now. Plan: Start Crestor 40 mg daily with concern for progressive angina.

## 2017-02-28 ENCOUNTER — Telehealth: Payer: Self-pay

## 2017-02-28 NOTE — Telephone Encounter (Signed)
Left detailed message per DPR.  Patient contacted pre-catheterization at [facility] scheduled for:  03/01/2017 @ 0730 Verified arrival time and place:  NT @ 0530 Confirmed AM meds to be taken pre-cath with sip of water: Take ASA Patient must have responsible person to drive home post procedure and observe patient for 24 hours Addl concerns:  Left this nurse name and# for call back if any questions

## 2017-03-01 ENCOUNTER — Encounter (HOSPITAL_COMMUNITY)
Admission: RE | Disposition: A | Discharge status: Home / Self Care | Payer: Self-pay | Source: Ambulatory Visit | Attending: Cardiology

## 2017-03-01 ENCOUNTER — Ambulatory Visit (HOSPITAL_COMMUNITY)
Admission: RE | Admit: 2017-03-01 | Discharge: 2017-03-01 | Disposition: A | Discharge status: Home / Self Care | Payer: BLUE CROSS/BLUE SHIELD | Source: Ambulatory Visit | Attending: Cardiology | Admitting: Cardiology

## 2017-03-01 ENCOUNTER — Encounter (HOSPITAL_COMMUNITY): Payer: Self-pay | Admitting: Cardiology

## 2017-03-01 DIAGNOSIS — Z7982 Long term (current) use of aspirin: Secondary | ICD-10-CM | POA: Insufficient documentation

## 2017-03-01 DIAGNOSIS — I10 Essential (primary) hypertension: Secondary | ICD-10-CM | POA: Diagnosis not present

## 2017-03-01 DIAGNOSIS — E785 Hyperlipidemia, unspecified: Secondary | ICD-10-CM | POA: Diagnosis not present

## 2017-03-01 DIAGNOSIS — I200 Unstable angina: Secondary | ICD-10-CM | POA: Diagnosis not present

## 2017-03-01 DIAGNOSIS — R079 Chest pain, unspecified: Secondary | ICD-10-CM | POA: Insufficient documentation

## 2017-03-01 DIAGNOSIS — K219 Gastro-esophageal reflux disease without esophagitis: Secondary | ICD-10-CM | POA: Diagnosis not present

## 2017-03-01 DIAGNOSIS — Z79899 Other long term (current) drug therapy: Secondary | ICD-10-CM | POA: Insufficient documentation

## 2017-03-01 DIAGNOSIS — Z01818 Encounter for other preprocedural examination: Secondary | ICD-10-CM

## 2017-03-01 DIAGNOSIS — R0789 Other chest pain: Secondary | ICD-10-CM | POA: Insufficient documentation

## 2017-03-01 HISTORY — PX: LEFT HEART CATH AND CORONARY ANGIOGRAPHY: CATH118249

## 2017-03-01 SURGERY — LEFT HEART CATH AND CORONARY ANGIOGRAPHY
Anesthesia: LOCAL

## 2017-03-01 MED ORDER — LIDOCAINE HCL 2 % IJ SOLN
INTRAMUSCULAR | Status: AC
  Filled 2017-03-01: qty 10

## 2017-03-01 MED ORDER — SODIUM CHLORIDE 0.9% FLUSH: 3.0000 mL | INTRAVENOUS | Status: DC | PRN

## 2017-03-01 MED ORDER — ACETAMINOPHEN 325 MG PO TABS: 650.0000 mg | ORAL_TABLET | ORAL | Status: DC | PRN

## 2017-03-01 MED ORDER — HEPARIN SODIUM (PORCINE) 1000 UNIT/ML IJ SOLN
INTRAMUSCULAR | Status: AC
  Filled 2017-03-01: qty 1

## 2017-03-01 MED ORDER — IOPAMIDOL (ISOVUE-370) INJECTION 76%
INTRAVENOUS | Status: AC
  Filled 2017-03-01: qty 100

## 2017-03-01 MED ORDER — SODIUM CHLORIDE 0.9 % IV SOLN: INTRAVENOUS | Status: AC

## 2017-03-01 MED ORDER — HEPARIN (PORCINE) IN NACL 2-0.9 UNIT/ML-% IJ SOLN
INTRAMUSCULAR | Status: AC
  Filled 2017-03-01: qty 1000

## 2017-03-01 MED ORDER — MIDAZOLAM HCL 2 MG/2ML IJ SOLN
INTRAMUSCULAR | Status: DC | PRN
  Administered 2017-03-01: 2 mg via INTRAVENOUS

## 2017-03-01 MED ORDER — SODIUM CHLORIDE 0.9 % IV SOLN
INTRAVENOUS | Status: DC
Start: 2017-03-01 — End: 2017-03-01
  Administered 2017-03-01: 07:00:00 via INTRAVENOUS

## 2017-03-01 MED ORDER — FENTANYL CITRATE (PF) 100 MCG/2ML IJ SOLN
INTRAMUSCULAR | Status: DC | PRN
  Administered 2017-03-01: 25 ug via INTRAVENOUS

## 2017-03-01 MED ORDER — SODIUM CHLORIDE 0.9% FLUSH: 3.0000 mL | Freq: Two times a day (BID) | INTRAVENOUS | Status: DC

## 2017-03-01 MED ORDER — MIDAZOLAM HCL 2 MG/2ML IJ SOLN
INTRAMUSCULAR | Status: AC
  Filled 2017-03-01: qty 2

## 2017-03-01 MED ORDER — HEPARIN (PORCINE) IN NACL 2-0.9 UNIT/ML-% IJ SOLN
INTRAMUSCULAR | Status: AC | PRN
  Administered 2017-03-01: 1000 mL

## 2017-03-01 MED ORDER — LIDOCAINE HCL (PF) 1 % IJ SOLN
INTRAMUSCULAR | Status: DC | PRN
  Administered 2017-03-01: 2 mL

## 2017-03-01 MED ORDER — ONDANSETRON HCL 4 MG/2ML IJ SOLN: 4.0000 mg | Freq: Four times a day (QID) | INTRAMUSCULAR | Status: DC | PRN

## 2017-03-01 MED ORDER — FENTANYL CITRATE (PF) 100 MCG/2ML IJ SOLN
INTRAMUSCULAR | Status: AC
  Filled 2017-03-01: qty 2

## 2017-03-01 MED ORDER — SODIUM CHLORIDE 0.9 % IV SOLN: 250.0000 mL | INTRAVENOUS | Status: DC | PRN

## 2017-03-01 MED ORDER — VERAPAMIL HCL 2.5 MG/ML IV SOLN
INTRAVENOUS | Status: AC
  Filled 2017-03-01: qty 2

## 2017-03-01 MED ORDER — HEPARIN SODIUM (PORCINE) 1000 UNIT/ML IJ SOLN
INTRAMUSCULAR | Status: DC | PRN
  Administered 2017-03-01: 6000 [IU] via INTRAVENOUS

## 2017-03-01 MED ORDER — HEPARIN (PORCINE) IN NACL 2-0.9 UNIT/ML-% IJ SOLN
INTRAMUSCULAR | Status: DC | PRN
  Administered 2017-03-01: 10 mL via INTRA_ARTERIAL

## 2017-03-01 MED ORDER — IOPAMIDOL (ISOVUE-370) INJECTION 76%
INTRAVENOUS | Status: DC | PRN
  Administered 2017-03-01: 70 mL via INTRA_ARTERIAL

## 2017-03-01 SURGICAL SUPPLY — 9 items
CATH OPTITORQUE TIG 4.0 5F (CATHETERS) ×2 IMPLANT
DEVICE RAD COMP TR BAND LRG (VASCULAR PRODUCTS) ×2 IMPLANT
GLIDESHEATH SLEND A-KIT 6F 22G (SHEATH) ×2 IMPLANT
GUIDEWIRE INQWIRE 1.5J.035X260 (WIRE) ×1 IMPLANT
INQWIRE 1.5J .035X260CM (WIRE) ×2
KIT HEART LEFT (KITS) ×2 IMPLANT
PACK CARDIAC CATHETERIZATION (CUSTOM PROCEDURE TRAY) ×2 IMPLANT
TRANSDUCER W/STOPCOCK (MISCELLANEOUS) ×2 IMPLANT
TUBING CIL FLEX 10 FLL-RA (TUBING) ×2 IMPLANT

## 2017-03-01 NOTE — Interval H&P Note (Signed)
History and Physical Interval Note:  03/01/2017 7:09 AM  Bobby Rodriguez  has presented today for surgery, with the diagnosis of progressive angina  The various methods of treatment have been discussed with the patient and family. After consideration of risks, benefits and other options for treatment, the patient has consented to  Procedure(s): LEFT HEART CATH AND CORONARY ANGIOGRAPHY (N/A) with possible Percutaneous Coronary Intervention as a surgical intervention .  The patient's history has been reviewed, patient examined, no change in status, stable for surgery.  I have reviewed the patient's chart and labs.  Questions were answered to the patient's satisfaction.    Cath Lab Visit (complete for each Cath Lab visit)  Clinical Evaluation Leading to the Procedure:   ACS: No.  Non-ACS:    Anginal Classification: CCS III  Anti-ischemic medical therapy: Maximal Therapy (2 or more classes of medications)  Non-Invasive Test Results: No non-invasive testing performed  Prior CABG: No previous CABG   Bryan Lemma

## 2017-03-01 NOTE — H&P (View-Only) (Signed)
 PCP: Jones, Thomas L, MD  Clinic Note: Chief Complaint  Patient presents with  . New Patient (Initial Visit)    exertional shortness of breath and chest pain    HPI: Bobby Rodriguez is a 45 y.o. male with a PMH below who presents today for emergency room follow-up..  Bobby Rodriguez as seen in emergency room on September 10 for recurrent episodes of left-sided chest discomfort it was intermittent. He did rule out for MI and was discharged home after second set of troponins for cardiology evaluation. - EKG:sinus rhythm, 82 BPM. PACs, inc RBBB; normal axis, intervals and durations. No ischemic ST and T-wave changes.  Recent Hospitalizations: ER visit above  Studies Personally Reviewed - (if available, images/films reviewed: From Epic Chart or Care Everywhere)  None  Interval History: Bobby Rodriguez is a very pleasant young gentleman with a history of hypertension and borderline dyslipidemia along with GERD.  He indicates that he is routinely active and exercises without difficulty, however if the end of June, he had a prolonged 30 minute episode of substernal chest pain while driving in his car.  He said of this symptom was associated with nausea and diffuse sweating. He also noted below but difficulty breathing. He pulled over the side of the road for a few minutes, and the symptom resolved spontaneously. He "blew the whole thing off" at that time stating that he felt it most of an indigestion, however that was not similar to his GERD type symptoms.Since then he's been noticing occasional j"olts" of chest pressure off & on lasting less than a minute - regardless of any activity.  However, in the last few weeks, he is been noticing more frequent episodes of chest pressure that feels like squeezing sensation on his chest. These episodes are also often on, but are lasting longer and associated with shortness of breath but less of the nausea and sweating that he had gone the first episode. He states that  now is having these episodes just about every day. He indicated that he really hasn't been doing much of his usual exercise since June, but when he has, he will note that the symptoms are significantly made worse by "cardio "exercise but not necessarily his strength exercises. When he gets his heart rate up to doing his cardio reps, he now can only goa few minutes without noticing it difficulty breathing feeling the tightness in his chest.  At worst the discomfort in his chest was the very first episode being at least 7/10, but of late is been more in the 3-5/10 range and he describes as a fullness and pressure in the upper part of his chest into his throat. It does not radiate to the arm or back.Most the time it's associated with shortness of breath, and if more prolonged will be associated with nausea and a little bit of sweating.  Bobby Rodriguez is now starting to get quite concerned and is fearful of doing any particular activity without being evaluated. He has had a history of GERD and indicates that none of these symptoms are similar to his GERD symptoms that some more burning sensation in his chest associated with a bitter taste in his mouth and this is nothing like that.  Other cardiac review of symptoms are essentially negative: He denies any heart failure symptoms of PND, orthopnea or edema.  No palpitations, lightheadedness, dizziness, weakness or syncope/near syncope. No TIA/amaurosis fugax symptoms. No melena, hematochezia, hematuria, or epstaxis. No claudication.  ROS: A comprehensive was performed.   Pertinent symptoms noted above   Epworth Sleepiness Scale: Situation   Chance of Dozing/Sleeping (0 = never , 1 = slight chance , 2 = moderate chance , 3 = high chance )   sitting and reading 1   watching TV 2   sitting inactive in a public place 2   being a passenger in a motor vehicle for an hour or more 3   lying down in the afternoon 3   sitting and talking to someone 1   sitting quietly after  lunch (no alcohol) 2   while stopped for a few minutes in traffic as the driver 2   Total Score  16: has HTN, is overweight. Memory/concentration concerns. Awakes not feeling rested & feels excessively tired during the day.   Review of Systems  Constitutional: Negative for malaise/fatigue.  HENT: Negative for congestion and sinus pain.   Eyes: Negative for blurred vision.  Respiratory: Negative for cough, shortness of breath (except with exertion) and wheezing.        See Epworth Score for OSA assessment questionaire  Cardiovascular:       Per history of present illness  Gastrointestinal: Positive for heartburn (actually relativly well-controlled).  Genitourinary: Negative for dysuria.  Musculoskeletal: Negative for joint pain and myalgias.  Neurological: Negative for dizziness.  Psychiatric/Behavioral:       See Epworth Score  All other systems reviewed and are negative.  PAD Screen 02/23/2017  Previous PAD dx? No  Previous surgical procedure? No  Pain with walking? No  Feet/toe relief with dangling? No  Painful, non-healing ulcers? No  Extremities discolored? No    I have reviewed and (if needed) personally updated the patient's problem list, medications, allergies, past medical and surgical history, social and family history.   Past Medical History:  Diagnosis Date  . Esophageal stricture   . GERD (gastroesophageal reflux disease)   . Hypertension     Past Surgical History:  Procedure Laterality Date  . WISDOM TOOTH EXTRACTION      Current Meds  Medication Sig  . aspirin EC 81 MG tablet Take 1 tablet (81 mg total) by mouth daily.  . DEXILANT 60 MG capsule TAKE 1 CAPSULE (60 MG TOTAL) BY MOUTH DAILY. (Patient not taking: Reported on 02/25/2017)  . olmesartan (BENICAR) 40 MG tablet TAKE 1 TABLET (40 MG TOTAL) BY MOUTH DAILY. (Patient not taking: Reported on 02/25/2017)  . [DISCONTINUED] fluocinonide-emollient (LIDEX-Bobby Rodriguez) 0.05 % cream Apply 1 application topically 2 (two)  times daily.    No Known Allergies  Social History   Social History  . Marital status: Married    Spouse name: N/A  . Number of children: 1  . Years of education: N/A   Occupational History  . Sales [employer]   Social History Main Topics  . Smoking status: Never Smoker  . Smokeless tobacco: Former User    Quit date: 06/15/2007  . Alcohol use No     Comment: 1-2 drinks per day  . Drug use: No  . Sexual activity: Yes   Other Topics Concern  . None   Social History Narrative  . None    family history includes Hypertension in his father; Ulcers in his father and sister.  Wt Readings from Last 3 Encounters:  02/23/17 241 lb 12.8 oz (109.7 kg)  07/21/15 255 lb (115.7 kg)  02/22/14 249 lb (112.9 kg)    PHYSICAL EXAM BP 138/88   Pulse 74   Ht 6' (1.829 m)   Wt 241   lb 12.8 oz (109.7 kg)   [name] 32.79 kg/m  Physical Exam  Constitutional: He is oriented to person, place, and time. He appears well-developed and well-nourished. No distress.  HENT:  Head: Normocephalic.  Mouth/Throat: Oropharynx is clear and moist. No oropharyngeal exudate.  Eyes: Pupils are equal, round, and reactive to light. Conjunctivae and EOM are normal. No scleral icterus.  Neck: Normal range of motion. Neck supple. No hepatojugular reflux and no JVD present. Carotid bruit is not present. No thyromegaly present.  Cardiovascular: Normal rate, regular rhythm, normal heart sounds and intact distal pulses.   No extrasystoles are present. PMI is not displaced.  Exam reveals no gallop and no friction rub.   No murmur heard. Pulmonary/Chest: Effort normal and breath sounds normal. No respiratory distress. He has no wheezes. He has no rales. He exhibits no tenderness.  Abdominal: Soft. Bowel sounds are normal. He exhibits no distension. There is no tenderness. There is no rebound and no guarding.  Musculoskeletal: Normal range of motion. He exhibits no edema or deformity.  Neurological: He is alert and  oriented to person, place, and time. No cranial nerve deficit.  Skin: Skin is warm and dry. No rash noted. No erythema. No pallor.  Psychiatric: He has a normal mood and affect. His behavior is normal. Judgment and thought content normal.  Nursing note and vitals reviewed.    Adult ECG Report - not checked today  Other studies Reviewed: Additional studies/ records that were reviewed today include:  Recent Labs:   Lab Results  Component Value Date   CREATININE 1.22 02/23/2017   BUN 14 02/23/2017   NA 140 02/23/2017   K 5.0 02/23/2017   CL 100 02/23/2017   CO2 27 02/23/2017   Lab Results  Component Value Date   CHOL 234 (H) 07/21/2015   HDL 42.70 07/21/2015   LDLCALC 173 (H) 07/21/2015   LDLDIRECT 148.8 12/28/2012   TRIG 90.0 07/21/2015   CHOLHDL 5 07/21/2015    ASSESSMENT / PLAN: Problem List Items Addressed This Visit    Dyslipidemia, goal LDL below 70    Last labs I have were from February 2017 his LDL was 173. Clearly not at goal now. Plan: Start Crestor 40 mg daily with concern for progressive angina.      Relevant Medications   nitroGLYCERIN (NITROSTAT) 0.4 MG SL tablet   isosorbide mononitrate (IMDUR) 30 MG 24 hr tablet   carvedilol (COREG) 3.125 MG tablet   rosuvastatin (CRESTOR) 40 MG tablet   Essential hypertension, benign (Chronic)    Borderline pressures today on ARB. He states that he really is only taking the Benicar off and on. With concern for progressive angina, I will start carvedilol 3.125 twice a day      Relevant Medications   nitroGLYCERIN (NITROSTAT) 0.4 MG SL tablet   isosorbide mononitrate (IMDUR) 30 MG 24 hr tablet   carvedilol (COREG) 3.125 MG tablet   rosuvastatin (CRESTOR) 40 MG tablet   Obesity ([name] 30.0-34.9)    He is trying to exercise, but now is unable to because of his symptoms. Hopefully once her clarify his chest pain symptoms we can get him back to his exercises.      Other abnormal glucose   Progressive angina (HCC) -  Primary    He presents today with symptoms that sound to me like progressive angina. Prolonged episode of chest discomfort 2 months ago which had several classic  Features of unstable angina including the substernal pressure radiating into his throat   with nausea and dyspnea and sweating.  He has had progressively worsening symptoms off and on since then - sometimes occurring to a lesser extent with rest if he is anxious.  However there now starting to happen with exertionand regardless her happening just about every day now. His cardiac risk factors include: male, hypertension and prior hyperlipidemia not on medications. He has symptoms suggestive of OSA  He progressive nature of his symptoms is concerning for unstable process, and therefore I feel invasive evaluation cardiac catheterization is warranted.  Plan: Schedule for cardiac catheterization next week  I will start him on low-dose carvedilol as well as Imdur 30 mg daily at bedtime.   He'll start taking baby aspirin every day.   I provided supplemental nitroglycerin.  Will also need to start statin: Crestor 40 mg daily      Relevant Medications   nitroGLYCERIN (NITROSTAT) 0.4 MG SL tablet   isosorbide mononitrate (IMDUR) 30 MG 24 hr tablet   carvedilol (COREG) 3.125 MG tablet   rosuvastatin (CRESTOR) 40 MG tablet   Other Relevant Orders   CBC (Completed)   Basic metabolic panel (Completed)   LEFT AND RIGHT HEART CATHETERIZATION WITH CORONARY ANGIOGRAM   Snoring    In the past there is discussion of potential polysomnogram evaluation, he wanted to lose weight. That was in 2014 still has symptoms. Pending results of his cardiac catheterization, I think the risks of OSA long-term are significant enough that we should probably consider OSA evaluation with polysomnogram.       Other Visit Diagnoses    Pre-op testing       Relevant Orders   CBC (Completed)   Basic metabolic panel (Completed)   Protime-INR (Completed)   LEFT AND  RIGHT HEART CATHETERIZATION WITH CORONARY ANGIOGRAM   Clotting disorder (HCC)       Relevant Orders   Protime-INR (Completed)      Current medicines are reviewed at length with the patient today. (+/- concerns) n/a The following changes have been made: see below  Patient Instructions    [facility] [address] [place] [address] Dept: [phone] Loc: [phone]  Bobby Rodriguez  02/23/2017  You are scheduled for a Cardiac Catheterization on Tuesday, September 18 with Dr. Marcelle Hepner.  1. Please arrive at the [facility] (Main Entrance A) at [facility]: [address] [place], [place] [address] at 5:00 AM (two hours before your procedure to ensure your preparation). Free valet parking service is available.   Special note: Every effort is made to have your procedure done on time. Please understand that emergencies sometimes delay scheduled procedures.  2. Diet: Do not eat or drink anything after midnight prior to your procedure except sips of water to take medications.  3. Labs: TODAY AT [facility]  CBC  PT  BMP  4. Medication instructions in preparation for your procedure:  On the morning of your procedure, take your Aspirin 81 MG and any morning medicines NOT listed above.  You may use sips of water.  5. Plan for one night stay--bring personal belongings. 6. Bring a current list of your medications and current insurance cards. 7. You MUST have a responsible person to drive you home. 8. Someone MUST be with you the first 24 hours after you arrive home or your discharge will be delayed. 9. Please wear clothes that are easy to get on and off and wear slip-on shoes.  Thank you   for allowing us to care for you!   --  Invasive Cardiovascular services   Your physician recommends that you schedule a follow-up appointment in 2 WEEKS WITH DR  Lee-Anne Flicker.     Studies Ordered:   Orders Placed This Encounter  Procedures  . CBC  . Basic metabolic panel  . Protime-INR  . LEFT AND RIGHT HEART CATHETERIZATION WITH CORONARY ANGIOGRAM      Tina Gruner, M.D., M.S. Interventional Cardiologist   Pager # [phone] Phone # [phone] [address] [place], [place] [address] 

## 2017-03-01 NOTE — Discharge Instructions (Signed)

## 2017-03-07 ENCOUNTER — Encounter: Payer: Self-pay | Admitting: Internal Medicine

## 2017-03-09 ENCOUNTER — Ambulatory Visit (INDEPENDENT_AMBULATORY_CARE_PROVIDER_SITE_OTHER): Payer: BLUE CROSS/BLUE SHIELD | Admitting: Cardiology

## 2017-03-09 ENCOUNTER — Encounter: Payer: Self-pay | Admitting: Cardiology

## 2017-03-09 DIAGNOSIS — E669 Obesity, unspecified: Secondary | ICD-10-CM | POA: Diagnosis not present

## 2017-03-09 DIAGNOSIS — R0683 Snoring: Secondary | ICD-10-CM | POA: Diagnosis not present

## 2017-03-09 DIAGNOSIS — I10 Essential (primary) hypertension: Secondary | ICD-10-CM | POA: Diagnosis not present

## 2017-03-09 DIAGNOSIS — R0789 Other chest pain: Secondary | ICD-10-CM | POA: Diagnosis not present

## 2017-03-09 DIAGNOSIS — I200 Unstable angina: Secondary | ICD-10-CM

## 2017-03-09 MED ORDER — DEXLANSOPRAZOLE 30 MG PO CPDR: 30.0000 mg | DELAYED_RELEASE_CAPSULE | Freq: Every day | ORAL | 6 refills | Status: DC

## 2017-03-09 MED ORDER — DEXLANSOPRAZOLE 60 MG PO CPDR: 60.0000 mg | DELAYED_RELEASE_CAPSULE | Freq: Every day | ORAL | 0 refills | Status: DC

## 2017-03-09 NOTE — Patient Instructions (Signed)
MEDICATION    TAKE 60 MG SAMPLES UNTIL COMPLETE BOTTLES THEN DEXLIANT 30 MG ON TABLET DAILY IN THE MORNING      Your physician recommends that you schedule a follow-up appointment ON AS NEEDED BASIS

## 2017-03-09 NOTE — Progress Notes (Signed)
PCP: Etta Grandchild, MD  Clinic Note: Chief Complaint  Patient presents with  . Follow-up    Post cath    HPI: Bobby Rodriguez is a 46 y.o. male with a PMH below who presents today for post cath follow-up.Bobby Rodriguez as seen on September 12 for ER follow-up for significant chest pain. I was concerned for this pain progressive angina. He was referred for cardiac catheterization and now presents for follow-up.  Recent Hospitalizations: ER visit above  Studies Personally Reviewed - (if available, images/films reviewed: From Epic Chart or Care Everywhere)  Cardiac cath 03/01/2017 -- Suspect nonanginal chest pain.  The left ventricular systolic function is normal. The left ventricular ejection fraction is 55-65% by visual estimate.  LV end diastolic pressure is normal.  Angiographically normal coronary arteries.     Interval History: "Bobby Rodriguez" returns today after his cardiac catheterization very happy about the results. He was very relieved to see that it wasn't cardiac in nature. He is back to the gym now. He still has some chest discomfort, but is not as worried about it now. He has them they're much shorter lasting symptoms and less severe. They occur off and on, but he is actually now able to exercise some. He actually noted that he was having some benefit from Prilosec, but thinks that in the past excellent and been more effective for treating his GERD in the past. He thinks that the symptoms may very well be GERD related because the now being noted after eating certain foods.  He now denies any further rapid irregular heartbeats or palpitations. No prolonged chest discomfort. No exertional dyspnea. No PND, orthopnea edema. No syncope/near-syncope or TIAs amaurosis fugax.  ROS: A comprehensive was performed. Pertinent symptoms noted above   Epworth Sleepiness Scale: Situation   Chance of Dozing/Sleeping (0 = never , 1 = slight chance , 2 = moderate chance , 3 = high chance  )   sitting and reading 1   watching TV 2   sitting inactive in a public place 2   being a passenger in a motor vehicle for an hour or more 3   lying down in the afternoon 3   sitting and talking to someone 1   sitting quietly after lunch (no alcohol) 2   while stopped for a few minutes in traffic as the driver 2   Total Score  16: has HTN, is overweight. Memory/concentration concerns. Awakes not feeling rested & feels excessively tired during the day.   Review of Systems  Constitutional: Negative for malaise/fatigue.  HENT: Negative for congestion and sinus pain.   Eyes: Negative for blurred vision.  Respiratory: Negative for cough, shortness of breath (except with exertion) and wheezing.        See Epworth Score for OSA assessment questionaire  Cardiovascular:       Per history of present illness  Gastrointestinal: Positive for heartburn (actually relativly well-controlled).  Genitourinary: Negative for dysuria.  Musculoskeletal: Negative for joint pain and myalgias.  Neurological: Negative for dizziness.  Psychiatric/Behavioral:       See Epworth Score  All other systems reviewed and are negative.   I have reviewed and (if needed) personally updated the patient's problem list, medications, allergies, past medical and surgical history, social and family history.   Past Medical History:  Diagnosis Date  . Esophageal stricture   . GERD (gastroesophageal reflux disease)   . Hypertension     Past Surgical History:  Procedure Laterality  Date  . LEFT HEART CATH AND CORONARY ANGIOGRAPHY N/A 03/01/2017   Procedure: LEFT HEART CATH AND CORONARY ANGIOGRAPHY;  Surgeon: Marykay Lex, MD;  Location: [facility] INVASIVE CV LAB;  Service: Cardiovascular: Mild/minimal LAD disease but otherwise normal coronary arteries. Normal EF..    . WISDOM TOOTH EXTRACTION      Current Meds  Medication Sig  . aspirin EC 81 MG tablet Take 1 tablet (81 mg total) by mouth daily.  . carvedilol (COREG)  3.125 MG tablet Take 1 tablet (3.125 mg total) by mouth 2 (two) times daily with a meal.  . nitroGLYCERIN (NITROSTAT) 0.4 MG SL tablet Place 1 tablet (0.4 mg total) under the tongue every 5 (five) minutes as needed for chest pain.  . Nutritional Supplements (JUICE PLUS FIBRE PO) Take 1 capsule by mouth daily.  Marland Kitchen omeprazole (PRILOSEC) 20 MG capsule Take 20 mg by mouth daily as needed (acid reflux).  . rosuvastatin (CRESTOR) 40 MG tablet Take 1 tablet (40 mg total) by mouth at bedtime.    Allergies  Allergen Reactions  . Shrimp [Shellfish Allergy]     Social History   Social History  . Marital status: Married    Spouse name: N/A  . Number of children: 1  . Years of education: N/A   Occupational History  . [employer]   Social History Main Topics  . Smoking status: Never Smoker  . Smokeless tobacco: Former Neurosurgeon    Quit date: 06/15/2007  . Alcohol use No     Comment: 1-2 drinks per day  . Drug use: No  . Sexual activity: Yes   Other Topics Concern  . None   Social History Narrative  . None    family history includes Hypertension in his father; Ulcers in his father and sister.  Wt Readings from Last 3 Encounters:  03/09/17 248 lb 6.4 oz (112.7 kg)  03/01/17 241 lb (109.3 kg)  02/23/17 241 lb 12.8 oz (109.7 kg)    PHYSICAL EXAM BP (!) 142/86   Pulse 89   Ht  (1.854 m)   Wt 248 lb 6.4 oz (112.7 kg)   SpO2 97%   [name] 32.77 kg/m  Physical Exam  Constitutional: He is oriented to person, place, and time. He appears well-developed and well-nourished. No distress.  HENT:  Head: Normocephalic and atraumatic.  Eyes: No scleral icterus.  Neck: Normal range of motion. Neck supple. No hepatojugular reflux and no JVD present. Carotid bruit is not present. No thyromegaly present.  Cardiovascular: Normal rate, regular rhythm, normal heart sounds and intact distal pulses.   No extrasystoles are present. PMI is not displaced.  Exam reveals no gallop and no friction rub.    No murmur heard. Pulmonary/Chest: Effort normal and breath sounds normal. No respiratory distress. He has no wheezes. He has no rales. He exhibits no tenderness.  Abdominal: Soft. Bowel sounds are normal. He exhibits no distension.  Musculoskeletal: Normal range of motion. He exhibits no edema or deformity.  Neurological: He is alert and oriented to person, place, and time.  Psychiatric: He has a normal mood and affect. His behavior is normal. Judgment and thought content normal.  Nursing note and vitals reviewed. R radial cath site looks good.  + Allen's test. No hematoma - small scar.   Adult ECG Report - not checked today  Other studies Reviewed: Additional studies/ records that were reviewed today include:  Recent Labs:   Lab Results  Component Value Date   CREATININE 1.22  02/23/2017   BUN 14 02/23/2017   NA 140 02/23/2017   K 5.0 02/23/2017   CL 100 02/23/2017   CO2 27 02/23/2017   Lab Results  Component Value Date   CHOL 234 (H) 07/21/2015   HDL 42.70 07/21/2015   LDLCALC 173 (H) 07/21/2015   LDLDIRECT 148.8 12/28/2012   TRIG 90.0 07/21/2015   CHOLHDL 5 07/21/2015    ASSESSMENT / PLAN: Problem List Items Addressed This Visit    Essential hypertension, benign (Chronic)    Still borderline pressure. He can probably tolerate going up on the carvedilol to 6.25 if it continues to be high. He still on losartan. Will defer now to PCP.      Obesity ([name] 30.0-34.9)    By [name] and Baird Lyons does meet criteria for obesity, however he is very muscular fit gentleman. He does however have an Epworth score of 16, and would probably benefit from following up on this and considering OSA evaluation.  Given that his audiogram was normally doesn't really seem to have active cardiac issues, I would defer this to his PCP as he will likely f/u with me only on a PRN basis.      Other chest pain    Suspected to be non-anginal chest pain based on his coronary angiogram. Probably related to  GERD or musculoskeletal. I didn't have prescribed PPI - Dexillant.      Progressive angina (HCC)    Thankfully, the symptoms he was having are clearly not related to coronary artery disease given his cardiac catheterization results. This is quite reassuring and therefore more consistent possibly with GERD. I will start him on Dexillant - which he will take for a few months to "settle things out" - would not necessarily stay on it long term.      Snoring    Epworth Score of 16 -- probably would benefit from OSA eval/ sleep study. He will discuss with PCP.         Current medicines are reviewed at length with the patient today. (+/- concerns) n/a The following changes have been made: see below  Patient Instructions  MEDICATION    TAKE 60 MG SAMPLES UNTIL COMPLETE BOTTLES THEN DEXLIANT 30 MG ON TABLET DAILY IN THE MORNING      Your physician recommends that you schedule a follow-up appointment ON AS NEEDED BASIS    Studies Ordered:   No orders of the defined types were placed in this encounter.     Bryan Lemma, M.D., M.S. Interventional Cardiologist   Pager # [phone] Phone # [phone] [address] [place], [place] [address]

## 2017-03-11 ENCOUNTER — Encounter: Payer: Self-pay | Admitting: Cardiology

## 2017-03-11 NOTE — Assessment & Plan Note (Signed)
Epworth Score of 16 -- probably would benefit from OSA eval/ sleep study. He will discuss with PCP.

## 2017-03-11 NOTE — Assessment & Plan Note (Signed)
Thankfully, the symptoms he was having are clearly not related to coronary artery disease given his cardiac catheterization results. This is quite reassuring and therefore more consistent possibly with GERD. I will start him on Dexillant - which he will take for a few months to "settle things out" - would not necessarily stay on it long term.

## 2017-03-11 NOTE — Assessment & Plan Note (Signed)
By [name] and Bobby Rodriguez does meet criteria for obesity, however he is very muscular fit gentleman. He does however have an Epworth score of 16, and would probably benefit from following up on this and considering OSA evaluation.  Given that his audiogram was normally doesn't really seem to have active cardiac issues, I would defer this to his PCP as he will likely f/u with me only on a PRN basis.

## 2017-03-11 NOTE — Assessment & Plan Note (Signed)
Still borderline pressure. He can probably tolerate going up on the carvedilol to 6.25 if it continues to be high. He still on losartan. Will defer now to PCP.

## 2017-03-11 NOTE — Assessment & Plan Note (Signed)
Suspected to be non-anginal chest pain based on his coronary angiogram. Probably related to GERD or musculoskeletal. I didn't have prescribed PPI - Dexillant.

## 2017-07-27 ENCOUNTER — Encounter: Payer: Self-pay | Admitting: Internal Medicine

## 2017-08-18 ENCOUNTER — Encounter: Payer: BLUE CROSS/BLUE SHIELD | Admitting: Internal Medicine

## 2017-08-23 ENCOUNTER — Encounter: Payer: Self-pay | Admitting: Internal Medicine

## 2017-08-23 ENCOUNTER — Ambulatory Visit (INDEPENDENT_AMBULATORY_CARE_PROVIDER_SITE_OTHER): Payer: BLUE CROSS/BLUE SHIELD | Admitting: Internal Medicine

## 2017-08-23 ENCOUNTER — Other Ambulatory Visit (INDEPENDENT_AMBULATORY_CARE_PROVIDER_SITE_OTHER): Payer: BLUE CROSS/BLUE SHIELD

## 2017-08-23 VITALS — BP 152/96 | HR 79 | Temp 97.9°F | Resp 16 | Ht 73.0 in | Wt 240.0 lb

## 2017-08-23 DIAGNOSIS — I10 Essential (primary) hypertension: Secondary | ICD-10-CM

## 2017-08-23 DIAGNOSIS — K222 Esophageal obstruction: Secondary | ICD-10-CM | POA: Diagnosis not present

## 2017-08-23 DIAGNOSIS — Z0000 Encounter for general adult medical examination without abnormal findings: Secondary | ICD-10-CM

## 2017-08-23 DIAGNOSIS — R739 Hyperglycemia, unspecified: Secondary | ICD-10-CM

## 2017-08-23 DIAGNOSIS — K2100 Gastro-esophageal reflux disease with esophagitis, without bleeding: Secondary | ICD-10-CM

## 2017-08-23 DIAGNOSIS — R7989 Other specified abnormal findings of blood chemistry: Secondary | ICD-10-CM

## 2017-08-23 DIAGNOSIS — E559 Vitamin D deficiency, unspecified: Secondary | ICD-10-CM | POA: Insufficient documentation

## 2017-08-23 DIAGNOSIS — E785 Hyperlipidemia, unspecified: Secondary | ICD-10-CM | POA: Diagnosis not present

## 2017-08-23 LAB — COMPREHENSIVE METABOLIC PANEL
ALT: 26 U/L (ref 0–53)
AST: 26 U/L (ref 0–37)
Albumin: 4.5 g/dL (ref 3.5–5.2)
Alkaline Phosphatase: 43 U/L (ref 39–117)
BUN: 17 mg/dL (ref 6–23)
CHLORIDE: 100 meq/L (ref 96–112)
CO2: 32 meq/L (ref 19–32)
Calcium: 10 mg/dL (ref 8.4–10.5)
Creatinine, Ser: 1.03 mg/dL (ref 0.40–1.50)
GFR: 82.18 mL/min (ref >60.00)
GLUCOSE: 105 mg/dL — AB (ref 70–99)
POTASSIUM: 4.9 meq/L (ref 3.5–5.1)
SODIUM: 139 meq/L (ref 135–145)
Total Bilirubin: 0.6 mg/dL (ref 0.2–1.2)
Total Protein: 7.3 g/dL (ref 6.0–8.3)

## 2017-08-23 LAB — URINALYSIS, ROUTINE W REFLEX MICROSCOPIC
Bilirubin Urine: NEGATIVE
Hgb urine dipstick: NEGATIVE
Ketones, ur: NEGATIVE
Leukocytes, UA: NEGATIVE
Nitrite: NEGATIVE
RBC / HPF: NONE SEEN (ref 0–?)
SPECIFIC GRAVITY, URINE: 1.015 (ref 1.000–1.030)
TOTAL PROTEIN, URINE-UPE24: NEGATIVE
URINE GLUCOSE: NEGATIVE
Urobilinogen, UA: 0.2 (ref 0.0–1.0)
WBC, UA: NONE SEEN (ref 0–?)
pH: 6.5 (ref 5.0–8.0)

## 2017-08-23 LAB — CBC WITH DIFFERENTIAL/PLATELET
Basophils Absolute: 0 10*3/uL (ref 0.0–0.1)
Basophils Relative: 0.5 % (ref 0.0–3.0)
EOS PCT: 6.9 % — AB (ref 0.0–5.0)
Eosinophils Absolute: 0.5 10*3/uL (ref 0.0–0.7)
HCT: 43.3 % (ref 39.0–52.0)
Hemoglobin: 15.2 g/dL (ref 13.0–17.0)
LYMPHS ABS: 2.6 10*3/uL (ref 0.7–4.0)
Lymphocytes Relative: 37 % (ref 12.0–46.0)
MCHC: 35.1 g/dL (ref 30.0–36.0)
MCV: 90.4 fl (ref 78.0–100.0)
MONO ABS: 0.7 10*3/uL (ref 0.1–1.0)
Monocytes Relative: 10.3 % (ref 3.0–12.0)
NEUTROS PCT: 45.3 % (ref 43.0–77.0)
Neutro Abs: 3.2 10*3/uL (ref 1.4–7.7)
Platelets: 207 10*3/uL (ref 150.0–400.0)
RBC: 4.79 Mil/uL (ref 4.22–5.81)
RDW: 12.7 % (ref 11.5–15.5)
WBC: 7 10*3/uL (ref 4.0–10.5)

## 2017-08-23 LAB — LIPID PANEL
Cholesterol: 206 mg/dL — ABNORMAL HIGH (ref 0–200)
HDL: 40.6 mg/dL (ref >39.00)
LDL Cholesterol: 132 mg/dL — ABNORMAL HIGH (ref 0–99)
NONHDL: 165.11
Total CHOL/HDL Ratio: 5
Triglycerides: 165 mg/dL — ABNORMAL HIGH (ref 0.0–149.0)
VLDL: 33 mg/dL (ref 0.0–40.0)

## 2017-08-23 LAB — VITAMIN D 25 HYDROXY (VIT D DEFICIENCY, FRACTURES): VITD: 27.51 ng/mL — AB (ref 30.00–100.00)

## 2017-08-23 LAB — HEMOGLOBIN A1C: Hgb A1c MFr Bld: 5.1 % (ref 4.6–6.5)

## 2017-08-23 MED ORDER — CHOLECALCIFEROL 50 MCG (2000 UT) PO TABS
1.0000 | ORAL_TABLET | Freq: Every day | ORAL | 1 refills | Status: DC
Start: 2017-08-23 — End: 2018-02-12

## 2017-08-23 MED ORDER — OLMESARTAN MEDOXOMIL 40 MG PO TABS
40.0000 mg | ORAL_TABLET | Freq: Every day | ORAL | 1 refills | Status: DC
Start: 2017-08-23 — End: 2018-02-15

## 2017-08-23 MED ORDER — ESOMEPRAZOLE MAGNESIUM 40 MG PO CPDR: 40.0000 mg | DELAYED_RELEASE_CAPSULE | Freq: Every day | ORAL | 3 refills | Status: DC

## 2017-08-23 NOTE — Patient Instructions (Signed)

## 2017-08-23 NOTE — Progress Notes (Signed)
Subjective:  Patient ID: Bobby Rodriguez, male    DOB: September 03, 1970  Age: 47 y.o. MRN: [id number]  CC: Hypertension; Gastroesophageal Reflux; and Annual Exam   HPI Bobby Rodriguez presents for a CPX  He complains of a 2-month history of worsening heartburn with dysphagia.  He had an EGD done a couple years ago and was found to have a stricture.  He was prescribed a high potency prescription PPI but it became too expensive so he has stopped taking it.  He denies odynophagia or weight loss.  He is also concerned that his blood pressure is not well controlled.  He was previously treated with carvedilol but has not been taking it.  He denies any recent episodes of headache, blurred vision, chest pain, shortness of breath, palpitations, edema, or fatigue.  Outpatient Medications Prior to Visit  Medication Sig Dispense Refill  . aspirin EC 81 MG tablet Take 1 tablet (81 mg total) by mouth daily. 30 tablet 0  . carvedilol (COREG) 3.125 MG tablet Take 1 tablet (3.125 mg total) by mouth 2 (two) times daily with a meal. 60 tablet 6  . Dexlansoprazole (DEXILANT) 30 MG capsule Take 1 capsule (30 mg total) by mouth daily. 30 capsule 6  . dexlansoprazole (DEXILANT) 60 MG capsule Take 1 capsule (60 mg total) by mouth daily. 15 capsule 0  . nitroGLYCERIN (NITROSTAT) 0.4 MG SL tablet Place 1 tablet (0.4 mg total) under the tongue every 5 (five) minutes as needed for chest pain. 25 tablet 6  . Nutritional Supplements (JUICE PLUS FIBRE PO) Take 1 capsule by mouth daily.    Marland Kitchen omeprazole (PRILOSEC) 20 MG capsule Take 20 mg by mouth daily as needed (acid reflux).    . rosuvastatin (CRESTOR) 40 MG tablet Take 1 tablet (40 mg total) by mouth at bedtime. 30 tablet 6   No facility-administered medications prior to visit.     ROS Review of Systems  Constitutional: Negative.  Negative for appetite change, chills, diaphoresis, fatigue and unexpected weight change.  HENT: Positive for trouble swallowing. Negative  for sore throat and voice change.   Eyes: Negative.  Negative for visual disturbance.  Respiratory: Negative.  Negative for apnea, cough, chest tightness, shortness of breath and wheezing.   Cardiovascular: Negative for chest pain, palpitations and leg swelling.  Gastrointestinal: Negative for abdominal pain, constipation, diarrhea, nausea and vomiting.  Endocrine: Negative.   Genitourinary: Negative.  Negative for difficulty urinating, dysuria, flank pain, hematuria, scrotal swelling, testicular pain and urgency.  Musculoskeletal: Negative for arthralgias, back pain, myalgias and neck pain.  Skin: Negative.  Negative for color change and pallor.  Allergic/Immunologic: Negative.   Neurological: Negative.  Negative for dizziness, weakness, light-headedness and numbness.  Hematological: Negative for adenopathy. Does not bruise/bleed easily.  Psychiatric/Behavioral: Negative.     Objective:  BP (!) 152/96 (BP Location: Left Arm, Patient Position: Sitting, Cuff Size: Large) Comment: BP (R) 150/96 (L) 152/96  Pulse 79   Temp 97.9 F (36.6 C) (Oral)   Resp 16   Ht 6\' 1"  (1.854 m)   Wt 240 lb (108.9 kg)   SpO2 98%   [name] 31.66 kg/m   BP Readings from Last 3 Encounters:  08/23/17 (!) 152/96  03/09/17 (!) 142/86  03/01/17 135/79    Wt Readings from Last 3 Encounters:  08/23/17 240 lb (108.9 kg)  03/09/17 248 lb 6.4 oz (112.7 kg)  03/01/17 241 lb (109.3 kg)    Physical Exam  Constitutional: He is oriented to person,  place, and time. No distress.  HENT:  Mouth/Throat: Oropharynx is clear and moist. No oropharyngeal exudate.  Eyes: Conjunctivae are normal. Left eye exhibits no discharge. No scleral icterus.  Neck: Normal range of motion. Neck supple. No JVD present. No thyromegaly present.  Cardiovascular: Normal rate, regular rhythm and normal heart sounds. Exam reveals no gallop.  No murmur heard. EKG ----  Sinus  Rhythm  -RSR(V1) -nondiagnostic.   PROBABLY NORMAL - no change  from the prior EKG   Pulmonary/Chest: Effort normal and breath sounds normal. No respiratory distress. He has no wheezes. He has no rales.  Abdominal: Bowel sounds are normal. He exhibits no distension and no mass. There is no tenderness. There is no guarding. Hernia confirmed negative in the right inguinal area and confirmed negative in the left inguinal area.  Genitourinary: Rectum normal, prostate normal, testes normal and penis normal. Rectal exam shows no external hemorrhoid, no internal hemorrhoid, no fissure, no mass, no tenderness, anal tone normal and guaiac negative stool. Prostate is not enlarged and not tender. Right testis shows no mass, no swelling and no tenderness. Left testis shows no mass, no swelling and no tenderness. Circumcised. No penile erythema or penile tenderness. No discharge found.  Musculoskeletal: Normal range of motion. He exhibits no edema or tenderness.  Lymphadenopathy:    He has no cervical adenopathy.       Right: No inguinal adenopathy present.       Left: No inguinal adenopathy present.  Neurological: He is alert and oriented to person, place, and time.  Skin: Skin is warm and dry. No rash noted. He is not diaphoretic. No erythema. No pallor.  Vitals reviewed.   Lab Results  Component Value Date   WBC 7.0 08/23/2017   HGB 15.2 08/23/2017   HCT 43.3 08/23/2017   PLT 207.0 08/23/2017   GLUCOSE 105 (H) 08/23/2017   CHOL 206 (H) 08/23/2017   TRIG 165.0 (H) 08/23/2017   HDL 40.60 08/23/2017   LDLDIRECT 148.8 12/28/2012   LDLCALC 132 (H) 08/23/2017   ALT 26 08/23/2017   AST 26 08/23/2017   NA 139 08/23/2017   K 4.9 08/23/2017   CL 100 08/23/2017   CREATININE 1.03 08/23/2017   BUN 17 08/23/2017   CO2 32 08/23/2017   TSH 5.32 (H) 08/23/2017   INR 1.0 02/23/2017   HGBA1C 5.1 08/23/2017    No results found.  Assessment & Plan:   Bobby Rodriguez was seen today for hypertension, gastroesophageal reflux and annual exam.  Diagnoses and all orders for this  visit:   Essential hypertension, benign- His blood pressure is not adequately well controlled.  I will treat the vitamin D deficiency.  His labs are negative for secondary causes or endorgan damage other than the vitamin D deficiency.  His EKG is negative for LVH or ischemia.  Will start an ARB. -     Comprehensive metabolic panel; Future -     CBC with Differential/Platelet; Future -     Thyroid Panel With TSH; Future -     Urinalysis, Routine w reflex microscopic; Future -     VITAMIN D 25 Hydroxy (Vit-D Deficiency, Fractures); Future -     EKG 12-Lead -     olmesartan (BENICAR) 40 MG tablet; Take 1 tablet (40 mg total) by mouth daily.  Esophageal stricture- Will restart a PPI and refer to GI to see if he needs to undergo another upper endoscopy with dilation. -     esomeprazole (NEXIUM) 40 MG capsule;  Take 1 capsule (40 mg total) by mouth daily. -     Ambulatory referral to Gastroenterology  Gastroesophageal reflux disease with esophagitis -     esomeprazole (NEXIUM) 40 MG capsule; Take 1 capsule (40 mg total) by mouth daily.  TSH elevation- His TSH remains very mildly elevated but he is asymptomatic with respect to this and his other TFTs are normal.  Will continue to monitor this without thyroid replacement therapy. -     Thyroid Panel With TSH; Future  Routine general medical examination at a health care facility-exam completed, labs reviewed, he refused a flu vaccine today, patient education material was given. -     Lipid panel; Future  Hyperglycemia- His A1c is down to 5.1%.  Improvement noted with lifestyle modifications. -     Hemoglobin A1c; Future  Vitamin D deficiency -     Cholecalciferol 2000 units TABS; Take 1 tablet (2,000 Units total) by mouth daily.  Dyslipidemia, goal LDL below 70- He has a low ASCVD risk score so I do not recommend that he start taking a statin for CV risk reduction.   I have discontinued Verna CzechConley D. Quintero "DEAN"'s aspirin EC, nitroGLYCERIN,  carvedilol, rosuvastatin, Nutritional Supplements (JUICE PLUS FIBRE PO), omeprazole, Dexlansoprazole, and dexlansoprazole. I am also having him start on esomeprazole, olmesartan, and Cholecalciferol.  Meds ordered this encounter  Medications  . esomeprazole (NEXIUM) 40 MG capsule    Sig: Take 1 capsule (40 mg total) by mouth daily.    Dispense:  30 capsule    Refill:  3  . olmesartan (BENICAR) 40 MG tablet    Sig: Take 1 tablet (40 mg total) by mouth daily.    Dispense:  90 tablet    Refill:  1  . Cholecalciferol 2000 units TABS    Sig: Take 1 tablet (2,000 Units total) by mouth daily.    Dispense:  90 tablet    Refill:  1     Follow-up: Return in about 3 months (around 11/23/2017).  Sanda Lingerhomas Dante Roudebush, MD

## 2017-08-24 LAB — THYROID PANEL WITH TSH
Free Thyroxine Index: 2.4 (ref 1.4–3.8)
T3 Uptake: 34 % (ref 22–35)
T4 TOTAL: 7.1 ug/dL (ref 4.9–10.5)
TSH: 5.32 mIU/L — ABNORMAL HIGH (ref 0.40–4.50)

## 2017-08-30 ENCOUNTER — Other Ambulatory Visit: Payer: Self-pay | Admitting: Internal Medicine

## 2017-08-30 DIAGNOSIS — N522 Drug-induced erectile dysfunction: Secondary | ICD-10-CM

## 2017-08-30 MED ORDER — AVANAFIL 100 MG PO TABS: 1.0000 | ORAL_TABLET | Freq: Every day | ORAL | 5 refills | Status: DC | PRN

## 2017-09-06 ENCOUNTER — Other Ambulatory Visit: Payer: Self-pay | Admitting: Internal Medicine

## 2017-09-06 ENCOUNTER — Encounter: Payer: Self-pay | Admitting: Internal Medicine

## 2017-09-06 ENCOUNTER — Telehealth: Payer: Self-pay

## 2017-09-06 DIAGNOSIS — N522 Drug-induced erectile dysfunction: Secondary | ICD-10-CM

## 2017-09-06 MED ORDER — TADALAFIL 20 MG PO TABS: 20.0000 mg | ORAL_TABLET | Freq: Every day | ORAL | 5 refills | Status: DC | PRN

## 2017-09-06 NOTE — Telephone Encounter (Signed)
Isosorbide mononitrate and nitroglycerin are the two medications that the pharmacy wanted us to know about.

## 2017-09-06 NOTE — Telephone Encounter (Signed)
Copied from CRM #[id number]. Topic: Quick Communication - See Telephone Encounter °>> Sep 06, 2017  3:08 PM Palacios Medina, Teresa D wrote: °[name] with [facility] called and said that the medication tadalafil (CIALIS) 20 MG tablet that Dr. Jones prescribed today may interfere with DEXLIANT 30 MG that patient is already taking. She would like to talk to provider or his CMA about this. Please call °her back [phone]. °

## 2017-09-06 NOTE — Telephone Encounter (Signed)
Sildenafil, tadalafil or brand name Cialis is what insurance will cover.

## 2017-09-06 NOTE — Telephone Encounter (Deleted)
Copied from CRM [phone]#[id number]. Topic: Quick Communication - See Telephone Encounter >> Sep 06, 2017  3:08 PM Louie BunPalacios Medina, [name] wrote: Bobby BarriosLouisa with [facility] called and said that the medication tadalafil (CIALIS) 20 MG tablet that Dr. Yetta BarreJones prescribed today may interfere with DEXLIANT 30 MG that patient is already taking. She would like to talk to provider or his CMA about this. Please call her back [phone].

## 2017-09-07 NOTE — Telephone Encounter (Signed)
Mychart message with pt regarding this note.

## 2017-09-09 NOTE — Telephone Encounter (Signed)
Pharmacy contacted: Per pt, he is not taking the isosorbide or the nitroglycerin. Those were temporary medication and is no longer following cardiology or taking those medications.  

## 2017-09-09 NOTE — Telephone Encounter (Signed)
Pharmacy contacted: Per pt, he is not taking the isosorbide or the nitroglycerin. Those were temporary medication and is no longer following cardiology or taking those medications.

## 2017-09-12 ENCOUNTER — Encounter: Payer: Self-pay | Admitting: Physician Assistant

## 2017-09-12 ENCOUNTER — Ambulatory Visit (INDEPENDENT_AMBULATORY_CARE_PROVIDER_SITE_OTHER): Payer: BLUE CROSS/BLUE SHIELD | Admitting: Physician Assistant

## 2017-09-12 VITALS — BP 118/72 | HR 84 | Ht 72.25 in | Wt 245.1 lb

## 2017-09-12 DIAGNOSIS — K219 Gastro-esophageal reflux disease without esophagitis: Secondary | ICD-10-CM

## 2017-09-12 DIAGNOSIS — Z8719 Personal history of other diseases of the digestive system: Secondary | ICD-10-CM

## 2017-09-12 DIAGNOSIS — R1314 Dysphagia, pharyngoesophageal phase: Secondary | ICD-10-CM | POA: Diagnosis not present

## 2017-09-12 MED ORDER — PANTOPRAZOLE SODIUM 40 MG PO TBEC: 40.0000 mg | DELAYED_RELEASE_TABLET | Freq: Every day | ORAL | 5 refills | Status: DC

## 2017-09-12 NOTE — Progress Notes (Signed)
Chief Complaint: Dysphagia, GERD  HPI:    Bobby Rodriguez is a 47 year old Caucasian male, who follows with Dr. Russella Dar, with a past medical history of esophageal stricture and GERD, who was referred to me by Etta Grandchild, MD for a complaint of dysphagia.      EGD 01/30/13 with a stricture at the GE junction and otherwise normal.  Patient symptoms resolved after procedure.    Today, describes he had a "cardiac scare" at the end of last year, he had a full workup with cardiac cath and was told that his heart was completely normal.  He was told he likely had reflux symptoms.  Interestingly patient tells me that the Dexilant 60 mg daily was too expensive for him and he stopped using this about a month before he developed chest pain symptoms and was only using Nexium 20 mg daily which he has continued until now.  Describes daily reflux symptoms with acid taste in his mouth as well as 2 recent instances of food/pills getting stuck on the way down.  Patient has already altered his diet and lifestyle habits to try and decrease instances of dysphagia but would like to go ahead and have a repeat EGD now to avoid further trouble.    Does admit to drinking caffeine as well as some alcohol and eating late into the night.    Denies fever, chills, blood in stool, melena, weight loss, anorexia, change in bowel habits or symptoms that awaken him at night.  Past Medical History:  Diagnosis Date  . Esophageal stricture   . GERD (gastroesophageal reflux disease)   . Hypertension     Past Surgical History:  Procedure Laterality Date  . LEFT HEART CATH AND CORONARY ANGIOGRAPHY N/A 03/01/2017   Procedure: LEFT HEART CATH AND CORONARY ANGIOGRAPHY;  Surgeon: Marykay Lex, MD;  Location: [facility] INVASIVE CV LAB;  Service: Cardiovascular: Mild/minimal LAD disease but otherwise normal coronary arteries. Normal EF..    . WISDOM TOOTH EXTRACTION      Current Outpatient Medications  Medication Sig Dispense Refill  .  Cholecalciferol 2000 units TABS Take 1 tablet (2,000 Units total) by mouth daily. 90 tablet 1  . esomeprazole (NEXIUM) 40 MG capsule Take 1 capsule (40 mg total) by mouth daily. 30 capsule 3  . olmesartan (BENICAR) 40 MG tablet Take 1 tablet (40 mg total) by mouth daily. 90 tablet 1  . tadalafil (CIALIS) 20 MG tablet Take 1 tablet (20 mg total) by mouth daily as needed for erectile dysfunction. 10 tablet 5   No current facility-administered medications for this visit.     Allergies as of 09/12/2017 - Review Complete 09/12/2017  Allergen Reaction Noted  . Shrimp [shellfish allergy]  03/01/2017    Family History  Problem Relation Age of Onset  . Hypertension Father   . Ulcers Father   . Ulcers Sister   . Stroke Neg Hx   . Kidney disease Neg Hx   . Cancer Neg Hx   . Diabetes Neg Hx   . Early death Neg Hx   . Hearing loss Neg Hx   . Heart disease Neg Hx   . Hyperlipidemia Neg Hx     Social History   Socioeconomic History  . Marital status: Married    Spouse name: Not on file  . Number of children: 1  . Years of education: Not on file  . Highest education level: Not on file  Occupational History  . Occupation: Investment banker, corporate:  [employer]  Social Needs  . Financial resource strain: Not on file  . Food insecurity:    Worry: Not on file    Inability: Not on file  . Transportation needs:    Medical: Not on file    Non-medical: Not on file  Tobacco Use  . Smoking status: Never Smoker  . Smokeless tobacco: Former Engineer, waterUser  Substance and Sexual Activity  . Alcohol use: No    Comment: 1-2 drinks per day  . Drug use: No  . Sexual activity: Yes  Lifestyle  . Physical activity:    Days per week: Not on file    Minutes per session: Not on file  . Stress: Not on file  Relationships  . Social connections:    Talks on phone: Not on file    Gets together: Not on file    Attends religious service: Not on file    Active member of club or organization: Not on file    Attends  meetings of clubs or organizations: Not on file    Relationship status: Not on file  . Intimate partner violence:    Fear of current or ex partner: Not on file    Emotionally abused: Not on file    Physically abused: Not on file    Forced sexual activity: Not on file  Other Topics Concern  . Not on file  Social History Narrative  . Not on file    Review of Systems:    Constitutional: No weight loss, fever or chills Skin: No rash  Cardiovascular: No chest pain Respiratory: No SOB  Gastrointestinal: See HPI and otherwise negative Genitourinary: No dysuria Neurological: No headache Musculoskeletal: No new muscle or joint pain Hematologic: No bleeding Psychiatric: No history of depression or anxiety   Physical Exam:  Vital signs: BP 118/72   Pulse 84   Ht 6' 0.25" (1.835 m)   Wt 245 lb 2 oz (111.2 kg)   [name] 33.02 kg/m   Constitutional:   Very Pleasant Caucasian male appears to be in NAD, Well developed, Well nourished, alert and cooperative Head:  Normocephalic and atraumatic. Eyes:   PEERL, EOMI. No icterus. Conjunctiva pink. Ears:  Normal auditory acuity. Neck:  Supple Throat: Oral cavity and pharynx without inflammation, swelling or lesion.  Respiratory: Respirations even and unlabored. Lungs clear to auscultation bilaterally.   No wheezes, crackles, or rhonchi.  Cardiovascular: Normal S1, S2. No MRG. Regular rate and rhythm. No peripheral edema, cyanosis or pallor.  Gastrointestinal:  Soft, nondistended, nontender. No rebound or guarding. Normal bowel sounds. No appreciable masses or hepatomegaly. Rectal:  Not performed.  Msk:  Symmetrical without gross deformities. Without edema, no deformity or joint abnormality.  Neurologic:  Alert and  oriented x4;  grossly normal neurologically.  Skin:   Dry and intact without significant lesions or rashes. Psychiatric: Demonstrates good judgement and reason without abnormal affect or behaviors.  MOST RECENT LABS AND  IMAGING: CBC    Component Value Date/Time   WBC 7.0 08/23/2017 1335   RBC 4.79 08/23/2017 1335   HGB 15.2 08/23/2017 1335   HGB 14.9 02/23/2017 1119   HCT 43.3 08/23/2017 1335   HCT 42.3 02/23/2017 1119   PLT 207.0 08/23/2017 1335   PLT 210 02/23/2017 1119   MCV 90.4 08/23/2017 1335   MCV 90 02/23/2017 1119   MCH 31.8 02/23/2017 1119   MCH 31.3 02/21/2017 1135   MCHC 35.1 08/23/2017 1335   RDW 12.7 08/23/2017 1335   RDW 12.9 02/23/2017  1119   LYMPHSABS 2.6 08/23/2017 1335   MONOABS 0.7 08/23/2017 1335   EOSABS 0.5 08/23/2017 1335   BASOSABS 0.0 08/23/2017 1335    CMP     Component Value Date/Time   NA 139 08/23/2017 1335   NA 140 02/23/2017 1119   K 4.9 08/23/2017 1335   CL 100 08/23/2017 1335   CO2 32 08/23/2017 1335   GLUCOSE 105 (H) 08/23/2017 1335   BUN 17 08/23/2017 1335   BUN 14 02/23/2017 1119   CREATININE 1.03 08/23/2017 1335   CALCIUM 10.0 08/23/2017 1335   PROT 7.3 08/23/2017 1335   ALBUMIN 4.5 08/23/2017 1335   AST 26 08/23/2017 1335   ALT 26 08/23/2017 1335   ALKPHOS 43 08/23/2017 1335   BILITOT 0.6 08/23/2017 1335   GFRNONAA 71 02/23/2017 1119   GFRAA 82 02/23/2017 1119    Assessment: 1.  Dysphagia: 2-3 times over the past month or so, patient has already had to alter his diet, previous peptic stricture in 2014; most likely repeat stricture 2.  GERD: Uncontrolled on Nexium 20 mg daily, Dexilant 60 mg qd was too expensive for patient  Plan: 1.  Patient to discontinue Nexium 20 mg daily and start Pantoprazole 40 mg daily, 30-60 minutes before breakfast.  Prescribed #30 with 5 refills.  Did discuss that we can increase to twice daily if this does not control his reflux symptoms. 2.  Scheduled patient for an EGD with dilation in the [facility] with Dr. Russella Dar due to his known history of peptic stricture and dilation in the past.  Discussed risk, benefits, limitations and alternatives and patient agrees to proceed. 3.  Reviewed anti-dysphagia measures including  taking small bites, drinking sips of water in between bites, chewing well and the chin tuck technique. 4.  Patient to follow in clinic per recommendations from Dr. Russella Dar after time of procedure.  Bobby Meeker, PA-C [facility] 09/12/2017, 10:29 AM  Cc: Etta Grandchild, MD

## 2017-09-12 NOTE — Progress Notes (Signed)
Reviewed and agree with management plan.  Kalese Ensz T. Devrin Monforte, MD FACG 

## 2017-09-12 NOTE — Patient Instructions (Signed)
You have been scheduled for an endoscopy. Please follow written instructions given to you at your visit today. If you use inhalers (even only as needed), please bring them with you on the day of your procedure. Your physician has requested that you go to www.startemmi.com and enter the access code given to you at your visit today. This web site gives a general overview about your procedure. However, you should still follow specific instructions given to you by our office regarding your preparation for the procedure.  We have sent the following medications to your pharmacy for you to pick up at your convenience: Pantprazole 40 mg daily 30-60 minutes before breakfast

## 2017-09-28 ENCOUNTER — Encounter: Payer: Self-pay | Admitting: Gastroenterology

## 2017-10-12 ENCOUNTER — Ambulatory Visit (AMBULATORY_SURGERY_CENTER): Payer: BLUE CROSS/BLUE SHIELD | Admitting: Gastroenterology

## 2017-10-12 ENCOUNTER — Encounter: Payer: Self-pay | Admitting: Gastroenterology

## 2017-10-12 ENCOUNTER — Other Ambulatory Visit: Payer: Self-pay

## 2017-10-12 VITALS — BP 104/62 | HR 82 | Temp 99.5°F | Resp 21 | Ht 72.0 in | Wt 245.0 lb

## 2017-10-12 DIAGNOSIS — K222 Esophageal obstruction: Secondary | ICD-10-CM | POA: Diagnosis not present

## 2017-10-12 DIAGNOSIS — R1310 Dysphagia, unspecified: Secondary | ICD-10-CM | POA: Diagnosis present

## 2017-10-12 DIAGNOSIS — K3189 Other diseases of stomach and duodenum: Secondary | ICD-10-CM | POA: Diagnosis not present

## 2017-10-12 DIAGNOSIS — R1319 Other dysphagia: Secondary | ICD-10-CM

## 2017-10-12 MED ORDER — SODIUM CHLORIDE 0.9 % IV SOLN: 500.0000 mL | Freq: Once | INTRAVENOUS | Status: DC

## 2017-10-12 NOTE — Progress Notes (Signed)
Report given to PACU, vss 

## 2017-10-12 NOTE — Patient Instructions (Signed)
**   Handout given on stricture and dilation diet. Make appt to follow up in the GI Office in 6 weeks **   YOU HAD AN ENDOSCOPIC PROCEDURE TODAY AT THE [facility]:   Refer to the procedure report that was given to you for any specific questions about what was found during the examination.  If the procedure report does not answer your questions, please call your gastroenterologist to clarify.  If you requested that your care partner not be given the details of your procedure findings, then the procedure report has been included in a sealed envelope for you to review at your convenience later.  YOU SHOULD EXPECT: Some feelings of bloating in the abdomen. Passage of more gas than usual.  Walking can help get rid of the air that was put into your GI tract during the procedure and reduce the bloating. If you had a lower endoscopy (such as a colonoscopy or flexible sigmoidoscopy) you may notice spotting of blood in your stool or on the toilet paper. If you underwent a bowel prep for your procedure, you may not have a normal bowel movement for a few days.  Please Note:  You might notice some irritation and congestion in your nose or some drainage.  This is from the oxygen used during your procedure.  There is no need for concern and it should clear up in a day or so.  SYMPTOMS TO REPORT IMMEDIATELY:   Following upper endoscopy (EGD)  Vomiting of blood or coffee ground material  New chest pain or pain under the shoulder blades  Painful or persistently difficult swallowing  New shortness of breath  Fever of 100F or higher  Black, tarry-looking stools  For urgent or emergent issues, a gastroenterologist can be reached at any hour by calling [phone].   DIET:  We do recommend a small meal at first, but then you may proceed to your regular diet.  Drink plenty of fluids but you should avoid alcoholic beverages for 24 hours.  ACTIVITY:  You should plan to take it easy for the rest of  today and you should NOT DRIVE or use heavy machinery until tomorrow (because of the sedation medicines used during the test).    FOLLOW UP: Our staff will call the number listed on your records the next business day following your procedure to check on you and address any questions or concerns that you may have regarding the information given to you following your procedure. If we do not reach you, we will leave a message.  However, if you are feeling well and you are not experiencing any problems, there is no need to return our call.  We will assume that you have returned to your regular daily activities without incident.  If any biopsies were taken you will be contacted by phone or by letter within the next 1-3 weeks.  Please call us at [phone] if you have not heard about the biopsies in 3 weeks.    SIGNATURES/CONFIDENTIALITY: You and/or your care partner have signed paperwork which will be entered into your electronic medical record.  These signatures attest to the fact that that the information above on your After Visit Summary has been reviewed and is understood.  Full responsibility of the confidentiality of this discharge information lies with you and/or your care-partner.

## 2017-10-12 NOTE — Op Note (Signed)
[facility] Patient Name: Bobby Rodriguez Procedure Date: 10/12/2017 4:20 PM MRN: [id number] Endoscopist: Meryl Dare , MD Age: 47 Referring MD:  Date of Birth: 1970-06-25 Gender: Male Account #: [id number] Procedure:                Upper GI endoscopy Indications:              Dysphagia Medicines:                Monitored Anesthesia Care Procedure:                Pre-Anesthesia Assessment:                           - Prior to the procedure, a History and Physical                            was performed, and patient medications and                            allergies were reviewed. The patient's tolerance of                            previous anesthesia was also reviewed. The risks                            and benefits of the procedure and the sedation                            options and risks were discussed with the patient.                            All questions were answered, and informed consent                            was obtained. Prior Anticoagulants: The patient has                            taken no previous anticoagulant or antiplatelet                            agents. ASA Grade Assessment: II - A patient with                            mild systemic disease. After reviewing the risks                            and benefits, the patient was deemed in                            satisfactory condition to undergo the procedure.                           After obtaining informed consent, the endoscope was  passed under direct vision. Throughout the                            procedure, the patient's blood pressure, pulse, and                            oxygen saturations were monitored continuously. The                            Endoscope was introduced through the mouth, and                            advanced to the second part of duodenum. The upper                            GI endoscopy was accomplished without  difficulty.                            The patient tolerated the procedure well. Scope In: Scope Out: Findings:                 One benign-appearing, intrinsic moderate stenosis                            was found at the gastroesophageal junction. This                            stenosis measured 1.1 cm (inner diameter). The                            stenosis was traversed. A guidewire was placed and                            the scope was withdrawn. Dilation was performed                            with a Savary dilator with no resistance at 12 mm.                            Dilations were performed with Savary dilators with                            mild resistance at 13 mm, 14 mm and 15 mm. Small                            amount of heme noted on the guidewire. No heme on                            dilators.                           The exam of the esophagus was otherwise normal.  The entire examined stomach was normal.                           The duodenal bulb and second portion of the                            duodenum were normal. Complications:            No immediate complications. Estimated Blood Loss:     Estimated blood loss was minimal. Impression:               - Benign-appearing esophageal stenosis. Dilated.                           - Normal stomach.                           - Normal duodenal bulb and second portion of the                            duodenum.                           - No specimens collected. Recommendation:           - Patient has a contact number available for                            emergencies. The signs and symptoms of potential                            delayed complications were discussed with the                            patient. Return to normal activities tomorrow.                            Written discharge instructions were provided to the                            patient.                            - Clear liquid diet for 2 hours, then advance as                            tolerated to soft diet today.                           - Continue present medications.                           - Antireflux measures.                           - Return to GI office in 6 weeks. Meryl Dare, MD 10/12/2017 4:52:37 PM This report has been signed electronically.

## 2017-10-13 ENCOUNTER — Telehealth: Payer: Self-pay | Admitting: *Deleted

## 2017-10-13 NOTE — Telephone Encounter (Signed)
  Follow up Call-  Call back number 10/12/2017  Post procedure Call Back phone  # [phone]  Permission to leave phone message Yes  Some recent data might be hidden     Patient questions:  Message left to call us if necessary.

## 2017-10-13 NOTE — Telephone Encounter (Signed)
No answer, message left for the patient. 

## 2017-11-21 ENCOUNTER — Ambulatory Visit: Payer: BLUE CROSS/BLUE SHIELD | Admitting: Internal Medicine

## 2018-02-12 ENCOUNTER — Other Ambulatory Visit: Payer: Self-pay | Admitting: Internal Medicine

## 2018-02-12 DIAGNOSIS — E559 Vitamin D deficiency, unspecified: Secondary | ICD-10-CM

## 2018-02-15 ENCOUNTER — Other Ambulatory Visit: Payer: Self-pay | Admitting: Internal Medicine

## 2018-02-15 DIAGNOSIS — I10 Essential (primary) hypertension: Secondary | ICD-10-CM

## 2018-03-08 ENCOUNTER — Other Ambulatory Visit: Payer: Self-pay | Admitting: Gastroenterology

## 2018-03-19 ENCOUNTER — Other Ambulatory Visit: Payer: Self-pay | Admitting: Internal Medicine

## 2018-03-19 DIAGNOSIS — I10 Essential (primary) hypertension: Secondary | ICD-10-CM

## 2018-03-25 ENCOUNTER — Other Ambulatory Visit: Payer: Self-pay | Admitting: Internal Medicine

## 2018-03-25 DIAGNOSIS — I10 Essential (primary) hypertension: Secondary | ICD-10-CM

## 2018-03-28 ENCOUNTER — Other Ambulatory Visit: Payer: Self-pay | Admitting: Internal Medicine

## 2018-03-28 DIAGNOSIS — I10 Essential (primary) hypertension: Secondary | ICD-10-CM

## 2018-04-03 ENCOUNTER — Other Ambulatory Visit: Payer: Self-pay | Admitting: Internal Medicine

## 2018-04-03 DIAGNOSIS — I10 Essential (primary) hypertension: Secondary | ICD-10-CM

## 2018-04-26 ENCOUNTER — Other Ambulatory Visit (INDEPENDENT_AMBULATORY_CARE_PROVIDER_SITE_OTHER): Payer: BLUE CROSS/BLUE SHIELD

## 2018-04-26 ENCOUNTER — Encounter: Payer: Self-pay | Admitting: Internal Medicine

## 2018-04-26 ENCOUNTER — Ambulatory Visit: Payer: BLUE CROSS/BLUE SHIELD | Admitting: Internal Medicine

## 2018-04-26 VITALS — BP 164/100 | HR 79 | Temp 98.2°F | Resp 16 | Ht 72.0 in | Wt 242.0 lb

## 2018-04-26 DIAGNOSIS — E559 Vitamin D deficiency, unspecified: Secondary | ICD-10-CM | POA: Diagnosis not present

## 2018-04-26 DIAGNOSIS — K760 Fatty (change of) liver, not elsewhere classified: Secondary | ICD-10-CM | POA: Diagnosis not present

## 2018-04-26 DIAGNOSIS — I10 Essential (primary) hypertension: Secondary | ICD-10-CM

## 2018-04-26 DIAGNOSIS — R7989 Other specified abnormal findings of blood chemistry: Secondary | ICD-10-CM

## 2018-04-26 LAB — URINALYSIS, ROUTINE W REFLEX MICROSCOPIC
Bilirubin Urine: NEGATIVE
Hgb urine dipstick: NEGATIVE
Ketones, ur: NEGATIVE
Leukocytes, UA: NEGATIVE
Nitrite: NEGATIVE
PH: 6.5 (ref 5.0–8.0)
RBC / HPF: NONE SEEN (ref 0–?)
Specific Gravity, Urine: 1.02 (ref 1.000–1.030)
Total Protein, Urine: NEGATIVE
URINE GLUCOSE: NEGATIVE
Urobilinogen, UA: 0.2 (ref 0.0–1.0)

## 2018-04-26 LAB — CBC WITH DIFFERENTIAL/PLATELET
BASOS ABS: 0.1 10*3/uL (ref 0.0–0.1)
Basophils Relative: 0.8 % (ref 0.0–3.0)
Eosinophils Absolute: 0.4 10*3/uL (ref 0.0–0.7)
Eosinophils Relative: 4.7 % (ref 0.0–5.0)
HCT: 41.6 % (ref 39.0–52.0)
HEMOGLOBIN: 14.6 g/dL (ref 13.0–17.0)
Lymphocytes Relative: 28.5 % (ref 12.0–46.0)
Lymphs Abs: 2.2 10*3/uL (ref 0.7–4.0)
MCHC: 35.2 g/dL (ref 30.0–36.0)
MCV: 90.6 fl (ref 78.0–100.0)
Monocytes Absolute: 0.6 10*3/uL (ref 0.1–1.0)
Monocytes Relative: 7.7 % (ref 3.0–12.0)
NEUTROS PCT: 58.3 % (ref 43.0–77.0)
Neutro Abs: 4.5 10*3/uL (ref 1.4–7.7)
Platelets: 212 10*3/uL (ref 150.0–400.0)
RBC: 4.59 Mil/uL (ref 4.22–5.81)
RDW: 13.2 % (ref 11.5–15.5)
WBC: 7.8 10*3/uL (ref 4.0–10.5)

## 2018-04-26 LAB — COMPREHENSIVE METABOLIC PANEL
ALK PHOS: 39 U/L (ref 39–117)
ALT: 47 U/L (ref 0–53)
AST: 34 U/L (ref 0–37)
Albumin: 4.6 g/dL (ref 3.5–5.2)
BUN: 16 mg/dL (ref 6–23)
CALCIUM: 9.6 mg/dL (ref 8.4–10.5)
CO2: 31 mEq/L (ref 19–32)
CREATININE: 1.1 mg/dL (ref 0.40–1.50)
Chloride: 102 mEq/L (ref 96–112)
GFR: 75.96 mL/min (ref >60.00)
Glucose, Bld: 95 mg/dL (ref 70–99)
Potassium: 4.2 mEq/L (ref 3.5–5.1)
SODIUM: 139 meq/L (ref 135–145)
TOTAL PROTEIN: 7 g/dL (ref 6.0–8.3)
Total Bilirubin: 0.4 mg/dL (ref 0.2–1.2)

## 2018-04-26 LAB — VITAMIN D 25 HYDROXY (VIT D DEFICIENCY, FRACTURES): VITD: 55.11 ng/mL (ref 30.00–100.00)

## 2018-04-26 MED ORDER — AZILSARTAN MEDOXOMIL 80 MG PO TABS: 1.0000 | ORAL_TABLET | Freq: Every day | ORAL | 0 refills | Status: DC

## 2018-04-26 NOTE — Patient Instructions (Signed)

## 2018-04-26 NOTE — Progress Notes (Signed)
Subjective:  Patient ID: Wandra FeinsteinConley D Hansson, male    DOB: 10/26/1970  Age: 47 y.o. MRN: [id number]  CC: Hypertension   HPI Wandra FeinsteinConley D Genther presents for a BP check - He is concerned that his blood pressure is not well controlled.  He has not recently been taking any antihypertensives because he ran out.  He denies headache, blurred vision, chest pain, shortness of breath, or palpitations.  Outpatient Medications Prior to Visit  Medication Sig Dispense Refill  . CVS D3 2000 units CAPS TAKE 1 CAPSULE BY MOUTH EVERY DAY 90 capsule 1  . pantoprazole (PROTONIX) 40 MG tablet TAKE 1 TABLET BY MOUTH EVERY DAY 30 tablet 5  . tadalafil (CIALIS) 20 MG tablet Take 1 tablet (20 mg total) by mouth daily as needed for erectile dysfunction. 10 tablet 5  . olmesartan (BENICAR) 40 MG tablet TAKE 1 TABLET BY MOUTH EVERY DAY 30 tablet 0  . 0.9 %  sodium chloride infusion      No facility-administered medications prior to visit.     ROS Review of Systems  Constitutional: Negative.  Negative for diaphoresis and fatigue.  HENT: Negative.   Eyes: Negative for visual disturbance.  Respiratory: Negative for cough, chest tightness, shortness of breath and wheezing.   Cardiovascular: Negative for chest pain, palpitations and leg swelling.  Gastrointestinal: Negative for abdominal pain, constipation, diarrhea, nausea and vomiting.  Endocrine: Negative.   Genitourinary: Negative for difficulty urinating.  Musculoskeletal: Negative.  Negative for back pain.  Skin: Negative.   Neurological: Negative.  Negative for dizziness, weakness, light-headedness and headaches.  Hematological: Negative for adenopathy. Does not bruise/bleed easily.  Psychiatric/Behavioral: Negative.     Objective:  BP (!) 164/100 (BP Location: Left Arm, Patient Position: Sitting, Cuff Size: Large)   Pulse 79   Temp 98.2 F (36.8 C) (Oral)   Resp 16   Ht 6' (1.829 m)   Wt 242 lb (109.8 kg)   SpO2 97%   [name] 32.82 kg/m   BP Readings  from Last 3 Encounters:  04/26/18 (!) 164/100  10/12/17 104/62  09/12/17 118/72    Wt Readings from Last 3 Encounters:  04/26/18 242 lb (109.8 kg)  10/12/17 245 lb (111.1 kg)  09/12/17 245 lb 2 oz (111.2 kg)    Physical Exam  Constitutional: He is oriented to person, place, and time. No distress.  HENT:  Mouth/Throat: Oropharynx is clear and moist. No oropharyngeal exudate.  Eyes: Conjunctivae are normal. No scleral icterus.  Neck: Normal range of motion. Neck supple. No JVD present. No thyromegaly present.  Cardiovascular: Normal rate, regular rhythm and normal heart sounds. Exam reveals no gallop.  No murmur heard. Pulmonary/Chest: Effort normal and breath sounds normal. No respiratory distress. He has no wheezes. He has no rales.  Abdominal: Soft. Bowel sounds are normal. There is no hepatosplenomegaly. There is no tenderness.  Musculoskeletal: Normal range of motion. He exhibits no edema, tenderness or deformity.  Lymphadenopathy:    He has no cervical adenopathy.  Neurological: He is alert and oriented to person, place, and time.  Skin: Skin is warm and dry. No rash noted. He is not diaphoretic.  Vitals reviewed.   Lab Results  Component Value Date   WBC 7.8 04/26/2018   HGB 14.6 04/26/2018   HCT 41.6 04/26/2018   PLT 212.0 04/26/2018   GLUCOSE 95 04/26/2018   CHOL 206 (H) 08/23/2017   TRIG 165.0 (H) 08/23/2017   HDL 40.60 08/23/2017   LDLDIRECT 148.8 12/28/2012   LDLCALC  132 (H) 08/23/2017   ALT 47 04/26/2018   AST 34 04/26/2018   NA 139 04/26/2018   K 4.2 04/26/2018   CL 102 04/26/2018   CREATININE 1.10 04/26/2018   BUN 16 04/26/2018   CO2 31 04/26/2018   TSH 4.09 04/26/2018   INR 1.0 02/23/2017   HGBA1C 5.1 08/23/2017    No results found.  Assessment & Plan:   Jaicion was seen today for hypertension.  Diagnoses and all orders for this visit:  Vitamin D deficiency- His vitamin D level is normal now.  Will continue the current vitamin D  supplementation. -     VITAMIN D 25 Hydroxy (Vit-D Deficiency, Fractures); Future  TSH elevation- His TFTs are normal and he appears euthyroid. -     Thyroid Panel With TSH; Future  Fatty liver disease, nonalcoholic- His LFTs are normal.  He was encouraged to improve his lifestyle modifications. -     Comprehensive metabolic panel; Future  Essential hypertension, benign- His blood pressure is not adequately well controlled.  Labs are negative for secondary causes or endorgan damage.  Will start an ARB. -     Azilsartan Medoxomil (EDARBI) 80 MG TABS; Take 1 tablet (80 mg total) by mouth daily. -     CBC with Differential/Platelet; Future -     Comprehensive metabolic panel; Future -     Urinalysis, Routine w reflex microscopic; Future   I have discontinued Kase Shughart. Girdler "DEAN"'s olmesartan. I am also having him start on Azilsartan Medoxomil. Additionally, I am having him maintain his tadalafil, CVS D3, and pantoprazole. We will stop administering sodium chloride.  Meds ordered this encounter  Medications  . Azilsartan Medoxomil (EDARBI) 80 MG TABS    Sig: Take 1 tablet (80 mg total) by mouth daily.    Dispense:  56 tablet    Refill:  0     Follow-up: Return in about 4 weeks (around 05/24/2018).  Sanda Linger, MD

## 2018-04-27 ENCOUNTER — Encounter: Payer: Self-pay | Admitting: Internal Medicine

## 2018-04-27 LAB — THYROID PANEL WITH TSH
Free Thyroxine Index: 2.7 (ref 1.4–3.8)
T3 Uptake: 35 % (ref 22–35)
T4, Total: 7.6 ug/dL (ref 4.9–10.5)
TSH: 4.09 m[IU]/L (ref 0.40–4.50)

## 2018-05-23 DIAGNOSIS — J0100 Acute maxillary sinusitis, unspecified: Secondary | ICD-10-CM | POA: Diagnosis not present

## 2018-05-23 DIAGNOSIS — J209 Acute bronchitis, unspecified: Secondary | ICD-10-CM | POA: Diagnosis not present

## 2018-05-29 ENCOUNTER — Ambulatory Visit: Payer: BLUE CROSS/BLUE SHIELD | Admitting: Internal Medicine

## 2018-05-29 ENCOUNTER — Other Ambulatory Visit (INDEPENDENT_AMBULATORY_CARE_PROVIDER_SITE_OTHER): Payer: BLUE CROSS/BLUE SHIELD

## 2018-05-29 ENCOUNTER — Encounter: Payer: Self-pay | Admitting: Internal Medicine

## 2018-05-29 VITALS — BP 124/78 | HR 78 | Temp 98.1°F | Resp 16 | Ht 72.0 in | Wt 238.0 lb

## 2018-05-29 DIAGNOSIS — I10 Essential (primary) hypertension: Secondary | ICD-10-CM

## 2018-05-29 LAB — BASIC METABOLIC PANEL
BUN: 18 mg/dL (ref 6–23)
CO2: 32 mEq/L (ref 19–32)
CREATININE: 0.97 mg/dL (ref 0.40–1.50)
Calcium: 9.2 mg/dL (ref 8.4–10.5)
Chloride: 99 mEq/L (ref 96–112)
GFR: 87.79 mL/min (ref >60.00)
Glucose, Bld: 92 mg/dL (ref 70–99)
Potassium: 3.7 mEq/L (ref 3.5–5.1)
Sodium: 138 mEq/L (ref 135–145)

## 2018-05-29 NOTE — Patient Instructions (Signed)

## 2018-05-29 NOTE — Progress Notes (Signed)
Subjective:  Patient ID: Bobby Rodriguez, male    DOB: Aug 18, 1970  Age: 47 y.o. MRN: [id number]  CC: Hypertension   HPI [name] presents for a BP check - He tells me his blood pressure has been well controlled on Edarbi.  He feels well today and offers no complaints.  Outpatient Medications Prior to Visit  Medication Sig Dispense Refill  . Azilsartan Medoxomil (EDARBI) 80 MG TABS Take 1 tablet (80 mg total) by mouth daily. 56 tablet 0  . [facility] D3 2000 units CAPS TAKE 1 CAPSULE BY MOUTH EVERY DAY 90 capsule 1  . pantoprazole (PROTONIX) 40 MG tablet TAKE 1 TABLET BY MOUTH EVERY DAY 30 tablet 5  . tadalafil (CIALIS) 20 MG tablet Take 1 tablet (20 mg total) by mouth daily as needed for erectile dysfunction. 10 tablet 5   No facility-administered medications prior to visit.     ROS Review of Systems  Constitutional: Negative for diaphoresis and fatigue.  HENT: Negative.   Eyes: Negative for visual disturbance.  Respiratory: Negative.  Negative for cough, chest tightness, shortness of breath and wheezing.   Cardiovascular: Negative for chest pain, palpitations and leg swelling.  Gastrointestinal: Negative for abdominal pain, constipation, diarrhea, nausea and vomiting.  Genitourinary: Negative.  Negative for difficulty urinating.  Musculoskeletal: Negative.  Negative for arthralgias, back pain and neck pain.  Skin: Negative.  Negative for color change.  Neurological: Negative for dizziness, facial asymmetry, weakness and light-headedness.  Hematological: Negative for adenopathy. Does not bruise/bleed easily.  Psychiatric/Behavioral: Negative.     Objective:  BP 124/78 (BP Location: Left Arm, Patient Position: Sitting, Cuff Size: Large)   Pulse 78   Temp 98.1 F (36.7 C) (Oral)   Resp 16   Ht 6' (1.829 m)   Wt 238 lb (108 kg)   SpO2 98%   [name] 32.28 kg/m   BP Readings from Last 3 Encounters:  05/29/18 124/78  04/26/18 (!) 164/100  10/12/17 104/62    Wt Readings  from Last 3 Encounters:  05/29/18 238 lb (108 kg)  04/26/18 242 lb (109.8 kg)  10/12/17 245 lb (111.1 kg)    Physical Exam Vitals signs reviewed.  HENT:     Nose: Nose normal.     Mouth/Throat:     Mouth: Mucous membranes are moist.     Pharynx: No posterior oropharyngeal erythema.  Eyes:     Conjunctiva/sclera: Conjunctivae normal.  Neck:     Musculoskeletal: Normal range of motion and neck supple. No muscular tenderness.  Cardiovascular:     Rate and Rhythm: Normal rate and regular rhythm.     Pulses: Normal pulses.     Heart sounds: Normal heart sounds. No murmur. No gallop.   Pulmonary:     Effort: Pulmonary effort is normal.     Breath sounds: Normal breath sounds. No stridor. No wheezing, rhonchi or rales.  Abdominal:     General: Abdomen is flat. Bowel sounds are normal.     Palpations: Abdomen is soft. There is no hepatomegaly, splenomegaly or mass.     Tenderness: There is no abdominal tenderness.  Musculoskeletal: Normal range of motion.        General: No swelling or tenderness.  Skin:    General: Skin is warm and dry.     Coloration: Skin is not pale.  Neurological:     General: No focal deficit present.     Mental Status: He is alert and oriented to person, place, and time. Mental status  is at baseline.     Lab Results  Component Value Date   WBC 7.8 04/26/2018   HGB 14.6 04/26/2018   HCT 41.6 04/26/2018   PLT 212.0 04/26/2018   GLUCOSE 92 05/29/2018   CHOL 206 (H) 08/23/2017   TRIG 165.0 (H) 08/23/2017   HDL 40.60 08/23/2017   LDLDIRECT 148.8 12/28/2012   LDLCALC 132 (H) 08/23/2017   ALT 47 04/26/2018   AST 34 04/26/2018   NA 138 05/29/2018   K 3.7 05/29/2018   CL 99 05/29/2018   CREATININE 0.97 05/29/2018   BUN 18 05/29/2018   CO2 32 05/29/2018   TSH 4.09 04/26/2018   INR 1.0 02/23/2017   HGBA1C 5.1 08/23/2017    No results found.  Assessment & Plan:   Katherine RoanConley was seen today for hypertension.  Diagnoses and all orders for this  visit:  Essential hypertension, benign- His blood pressure is well controlled.  Electrolytes and renal function are normal.  Will continue Edarbi at the current dose. -     Basic metabolic panel; Future   I am having Katherine Roanonley D. Cerveny "DEAN" maintain his tadalafil, CVS D3, pantoprazole, and Azilsartan Medoxomil.  No orders of the defined types were placed in this encounter.    Follow-up: Return in about 6 months (around 11/28/2018).  Sanda Lingerhomas Camy Leder, MD

## 2018-08-08 ENCOUNTER — Other Ambulatory Visit: Payer: Self-pay | Admitting: Internal Medicine

## 2018-08-08 DIAGNOSIS — E559 Vitamin D deficiency, unspecified: Secondary | ICD-10-CM

## 2018-08-22 ENCOUNTER — Other Ambulatory Visit: Payer: Self-pay | Admitting: Internal Medicine

## 2018-08-22 ENCOUNTER — Encounter: Payer: Self-pay | Admitting: Internal Medicine

## 2018-08-22 DIAGNOSIS — I10 Essential (primary) hypertension: Secondary | ICD-10-CM

## 2018-08-22 MED ORDER — OLMESARTAN MEDOXOMIL 40 MG PO TABS: 40.0000 mg | ORAL_TABLET | Freq: Every day | ORAL | 1 refills | Status: DC

## 2018-09-01 ENCOUNTER — Other Ambulatory Visit: Payer: Self-pay | Admitting: Gastroenterology

## 2018-09-20 DIAGNOSIS — L820 Inflamed seborrheic keratosis: Secondary | ICD-10-CM | POA: Diagnosis not present

## 2018-09-20 DIAGNOSIS — L918 Other hypertrophic disorders of the skin: Secondary | ICD-10-CM | POA: Diagnosis not present

## 2018-09-20 DIAGNOSIS — D1801 Hemangioma of skin and subcutaneous tissue: Secondary | ICD-10-CM | POA: Diagnosis not present

## 2018-09-20 DIAGNOSIS — L720 Epidermal cyst: Secondary | ICD-10-CM | POA: Diagnosis not present

## 2018-11-07 ENCOUNTER — Other Ambulatory Visit: Payer: Self-pay | Admitting: Internal Medicine

## 2018-11-07 DIAGNOSIS — N522 Drug-induced erectile dysfunction: Secondary | ICD-10-CM

## 2018-11-19 ENCOUNTER — Other Ambulatory Visit: Payer: Self-pay | Admitting: Internal Medicine

## 2018-11-19 DIAGNOSIS — I10 Essential (primary) hypertension: Secondary | ICD-10-CM

## 2018-12-24 DIAGNOSIS — U071 COVID-19: Secondary | ICD-10-CM | POA: Diagnosis not present

## 2018-12-24 DIAGNOSIS — R509 Fever, unspecified: Secondary | ICD-10-CM | POA: Diagnosis not present

## 2018-12-24 DIAGNOSIS — Z20828 Contact with and (suspected) exposure to other viral communicable diseases: Secondary | ICD-10-CM | POA: Diagnosis not present

## 2018-12-24 IMAGING — CR DG CHEST 2V
2 series · 2 of 2 positions shown · non-contrast
Comparison: None.

CLINICAL DATA: Shortness of breath and chest discomfort

EXAM:
CHEST  2 VIEW

[chest pa]
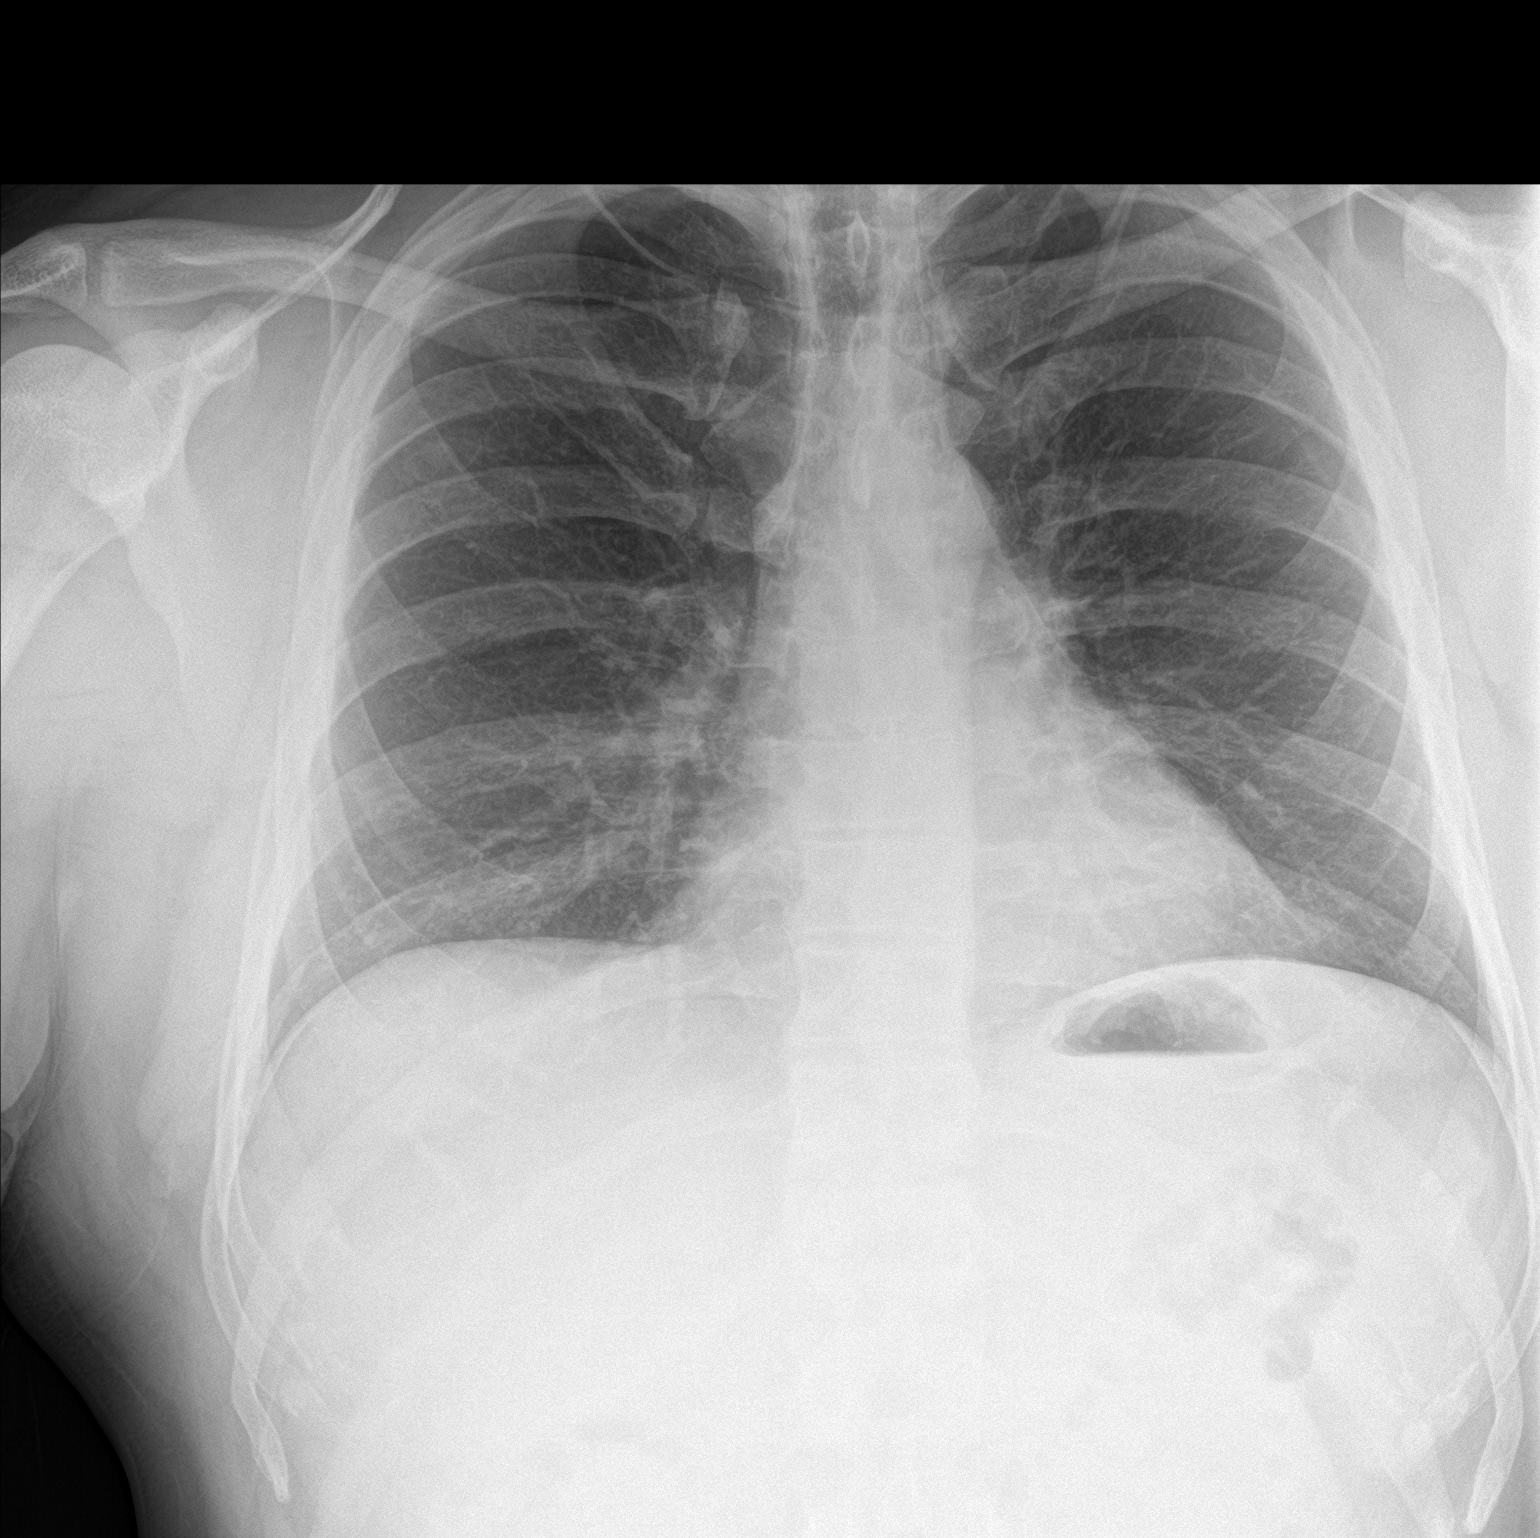

[chest lat]
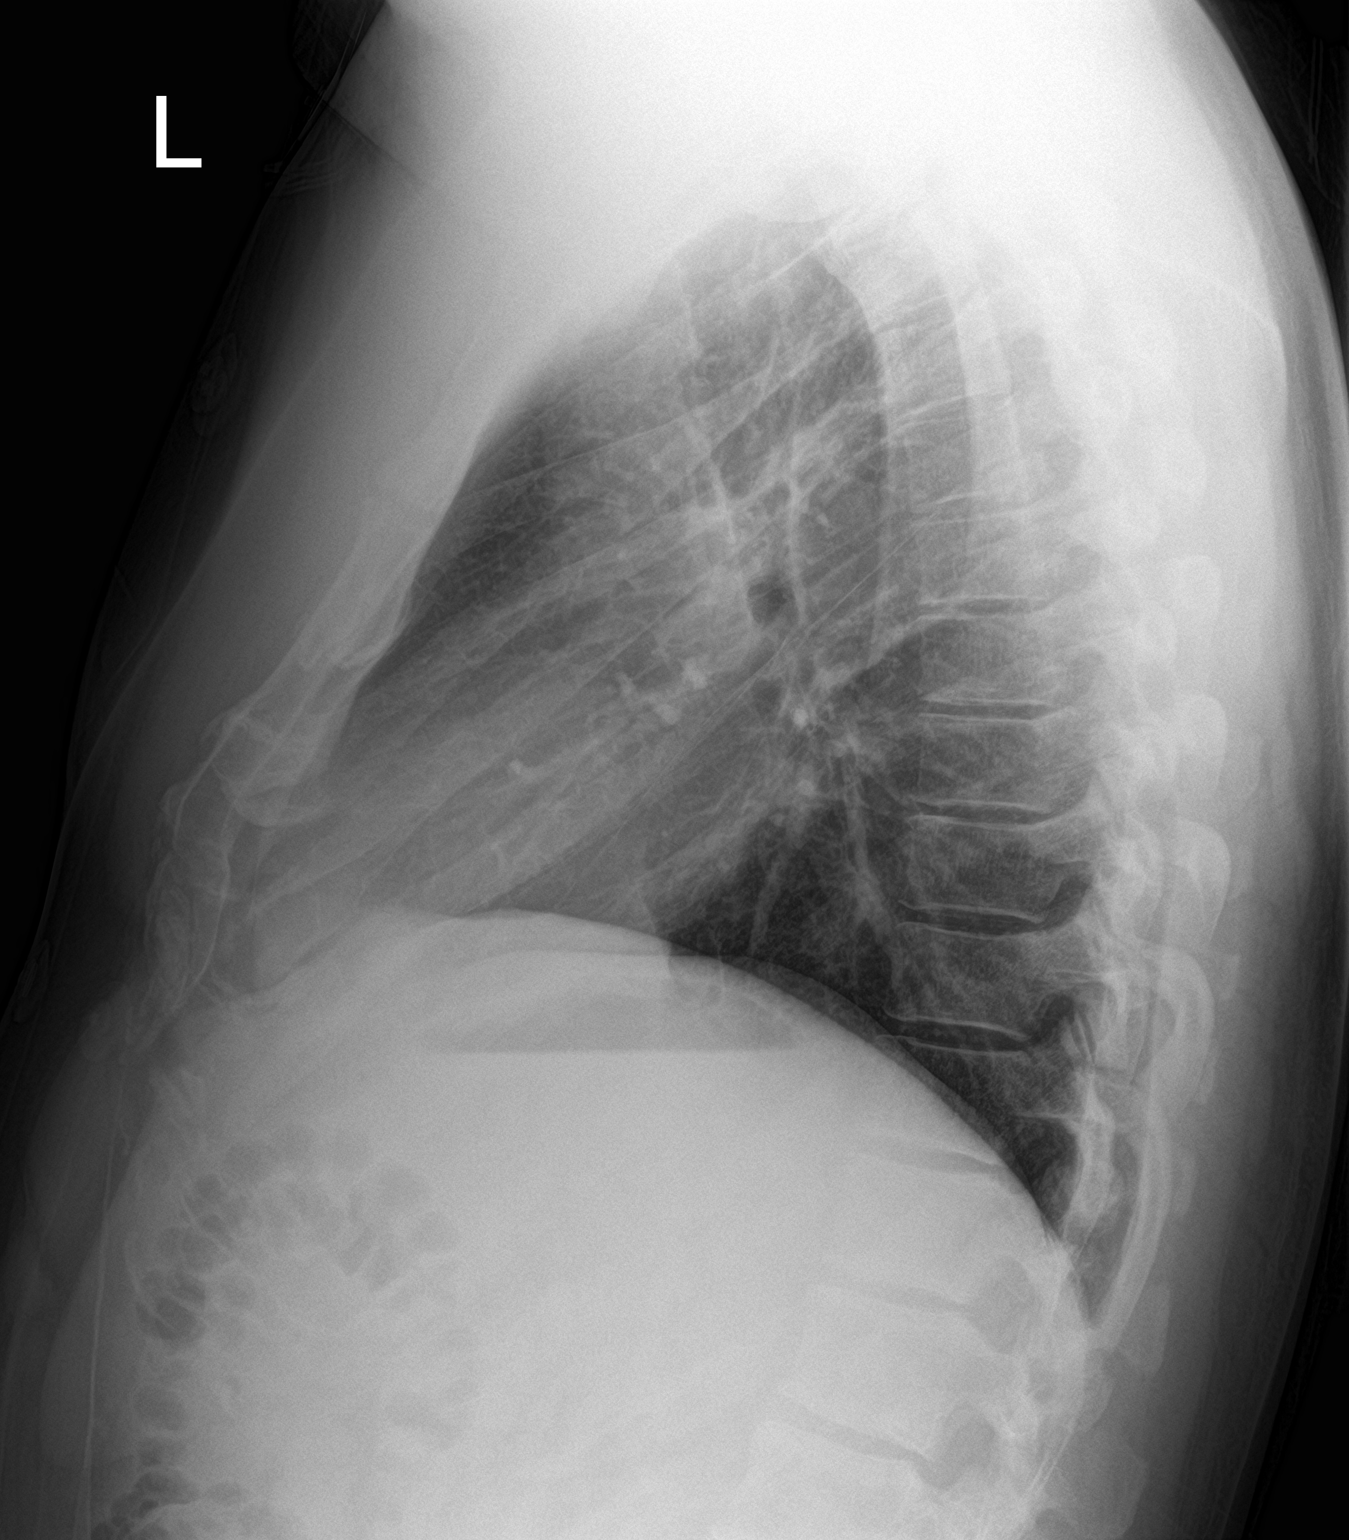

[2 of 2 positions shown; findings below may reference images not displayed]

FINDINGS: Lungs are clear. Heart size and pulmonary vascularity are normal. No
adenopathy. No pneumothorax. No bone lesions.
IMPRESSION: No edema or consolidation.

## 2019-02-03 ENCOUNTER — Other Ambulatory Visit: Payer: Self-pay | Admitting: Internal Medicine

## 2019-02-03 DIAGNOSIS — E559 Vitamin D deficiency, unspecified: Secondary | ICD-10-CM

## 2019-02-14 DIAGNOSIS — M25512 Pain in left shoulder: Secondary | ICD-10-CM | POA: Diagnosis not present

## 2019-02-27 DIAGNOSIS — M25512 Pain in left shoulder: Secondary | ICD-10-CM | POA: Diagnosis not present

## 2019-03-05 DIAGNOSIS — M25512 Pain in left shoulder: Secondary | ICD-10-CM | POA: Diagnosis not present

## 2019-03-12 DIAGNOSIS — M25512 Pain in left shoulder: Secondary | ICD-10-CM | POA: Diagnosis not present

## 2019-04-13 ENCOUNTER — Telehealth: Payer: Self-pay | Admitting: Internal Medicine

## 2019-04-13 DIAGNOSIS — E785 Hyperlipidemia, unspecified: Secondary | ICD-10-CM

## 2019-04-13 DIAGNOSIS — K2100 Gastro-esophageal reflux disease with esophagitis, without bleeding: Secondary | ICD-10-CM

## 2019-04-13 MED ORDER — PANTOPRAZOLE SODIUM 40 MG PO TBEC: 40.0000 mg | DELAYED_RELEASE_TABLET | Freq: Every day | ORAL | 0 refills | Status: DC

## 2019-04-13 NOTE — Telephone Encounter (Signed)
30 day erx sent to pharmacy. Pt informed of same. Pt has an appt scheduled for 04/23/2019

## 2019-04-13 NOTE — Telephone Encounter (Signed)
Pt called requesting pantoprazole (PROTONIX) 40 MG [facility]

## 2019-04-18 ENCOUNTER — Encounter: Payer: BLUE CROSS/BLUE SHIELD | Admitting: Internal Medicine

## 2019-04-23 ENCOUNTER — Other Ambulatory Visit (INDEPENDENT_AMBULATORY_CARE_PROVIDER_SITE_OTHER): Payer: BC Managed Care – PPO

## 2019-04-23 ENCOUNTER — Encounter: Payer: Self-pay | Admitting: Internal Medicine

## 2019-04-23 ENCOUNTER — Other Ambulatory Visit: Payer: Self-pay

## 2019-04-23 ENCOUNTER — Ambulatory Visit (INDEPENDENT_AMBULATORY_CARE_PROVIDER_SITE_OTHER): Payer: BC Managed Care – PPO | Admitting: Internal Medicine

## 2019-04-23 VITALS — BP 124/76 | HR 94 | Temp 98.3°F | Resp 16 | Ht 72.0 in | Wt 241.0 lb

## 2019-04-23 DIAGNOSIS — E785 Hyperlipidemia, unspecified: Secondary | ICD-10-CM

## 2019-04-23 DIAGNOSIS — Z0000 Encounter for general adult medical examination without abnormal findings: Secondary | ICD-10-CM | POA: Diagnosis not present

## 2019-04-23 DIAGNOSIS — I10 Essential (primary) hypertension: Secondary | ICD-10-CM

## 2019-04-23 DIAGNOSIS — R202 Paresthesia of skin: Secondary | ICD-10-CM

## 2019-04-23 DIAGNOSIS — R200 Anesthesia of skin: Secondary | ICD-10-CM

## 2019-04-23 DIAGNOSIS — M79606 Pain in leg, unspecified: Secondary | ICD-10-CM

## 2019-04-23 DIAGNOSIS — E559 Vitamin D deficiency, unspecified: Secondary | ICD-10-CM

## 2019-04-23 DIAGNOSIS — R7989 Other specified abnormal findings of blood chemistry: Secondary | ICD-10-CM | POA: Diagnosis not present

## 2019-04-23 DIAGNOSIS — E039 Hypothyroidism, unspecified: Secondary | ICD-10-CM

## 2019-04-23 DIAGNOSIS — K2100 Gastro-esophageal reflux disease with esophagitis, without bleeding: Secondary | ICD-10-CM

## 2019-04-23 LAB — URINALYSIS, ROUTINE W REFLEX MICROSCOPIC
Bilirubin Urine: NEGATIVE
Hgb urine dipstick: NEGATIVE
Ketones, ur: NEGATIVE
Leukocytes,Ua: NEGATIVE
Nitrite: NEGATIVE
RBC / HPF: NONE SEEN (ref 0–?)
Specific Gravity, Urine: 1.015 (ref 1.000–1.030)
Total Protein, Urine: NEGATIVE
Urine Glucose: NEGATIVE
Urobilinogen, UA: 0.2 (ref 0.0–1.0)
WBC, UA: NONE SEEN (ref 0–?)
pH: 6.5 (ref 5.0–8.0)

## 2019-04-23 LAB — HEPATIC FUNCTION PANEL
ALT: 41 U/L (ref 0–53)
AST: 29 U/L (ref 0–37)
Albumin: 4.8 g/dL (ref 3.5–5.2)
Alkaline Phosphatase: 37 U/L — ABNORMAL LOW (ref 39–117)
Bilirubin, Direct: 0.1 mg/dL (ref 0.0–0.3)
Total Bilirubin: 0.6 mg/dL (ref 0.2–1.2)
Total Protein: 7 g/dL (ref 6.0–8.3)

## 2019-04-23 LAB — BASIC METABOLIC PANEL
BUN: 16 mg/dL (ref 6–23)
CO2: 30 mEq/L (ref 19–32)
Calcium: 9.4 mg/dL (ref 8.4–10.5)
Chloride: 101 mEq/L (ref 96–112)
Creatinine, Ser: 1.07 mg/dL (ref 0.40–1.50)
GFR: 73.48 mL/min (ref >60.00)
Glucose, Bld: 106 mg/dL — ABNORMAL HIGH (ref 70–99)
Potassium: 4.2 mEq/L (ref 3.5–5.1)
Sodium: 138 mEq/L (ref 135–145)

## 2019-04-23 LAB — CBC WITH DIFFERENTIAL/PLATELET
Basophils Absolute: 0 10*3/uL (ref 0.0–0.1)
Basophils Relative: 0.6 % (ref 0.0–3.0)
Eosinophils Absolute: 0.3 10*3/uL (ref 0.0–0.7)
Eosinophils Relative: 5.1 % — ABNORMAL HIGH (ref 0.0–5.0)
HCT: 41 % (ref 39.0–52.0)
Hemoglobin: 14.4 g/dL (ref 13.0–17.0)
Lymphocytes Relative: 36.9 % (ref 12.0–46.0)
Lymphs Abs: 2.5 10*3/uL (ref 0.7–4.0)
MCHC: 35.1 g/dL (ref 30.0–36.0)
MCV: 91.8 fl (ref 78.0–100.0)
Monocytes Absolute: 0.6 10*3/uL (ref 0.1–1.0)
Monocytes Relative: 9.2 % (ref 3.0–12.0)
Neutro Abs: 3.2 10*3/uL (ref 1.4–7.7)
Neutrophils Relative %: 48.2 % (ref 43.0–77.0)
Platelets: 204 10*3/uL (ref 150.0–400.0)
RBC: 4.46 Mil/uL (ref 4.22–5.81)
RDW: 12.5 % (ref 11.5–15.5)
WBC: 6.7 10*3/uL (ref 4.0–10.5)

## 2019-04-23 LAB — LIPID PANEL
Cholesterol: 238 mg/dL — ABNORMAL HIGH (ref 0–200)
HDL: 42.5 mg/dL (ref >39.00)
LDL Cholesterol: 171 mg/dL — ABNORMAL HIGH (ref 0–99)
NonHDL: 195.48
Total CHOL/HDL Ratio: 6
Triglycerides: 123 mg/dL (ref 0.0–149.0)
VLDL: 24.6 mg/dL (ref 0.0–40.0)

## 2019-04-23 LAB — VITAMIN B12: Vitamin B-12: 499 pg/mL (ref 211–911)

## 2019-04-23 LAB — TSH: TSH: 5.68 u[IU]/mL — ABNORMAL HIGH (ref 0.35–4.50)

## 2019-04-23 LAB — VITAMIN D 25 HYDROXY (VIT D DEFICIENCY, FRACTURES): VITD: 50.27 ng/mL (ref 30.00–100.00)

## 2019-04-23 MED ORDER — PANTOPRAZOLE SODIUM 40 MG PO TBEC: 40.0000 mg | DELAYED_RELEASE_TABLET | Freq: Every day | ORAL | 1 refills | Status: DC

## 2019-04-23 NOTE — Progress Notes (Signed)
Subjective:  Patient ID: Bobby Rodriguez, male    DOB: [birth year]  Age: 48 y.o. MRN: [id number]  CC: Annual Exam, Hypertension, Hyperlipidemia, and Gastroesophageal Reflux   HPI Bobby Rodriguez presents for a CPX.  He was diagnosed with Covid-19 about 3 months ago.  He feels like he has recovered but for the last 2 months he has had intermittent swelling and discomfort in his distal lower extremities.  He does not describe it as claudication.  He complains of mild numbness and tingling in his left lower extremity.  He denies neck or back pain.  He tells me his blood pressure has been well controlled.  He tells me his GERD symptoms are well controlled with the PPI.  Outpatient Medications Prior to Visit  Medication Sig Dispense Refill  . [facility]S D3 50 MCG (2000 UT) CAPS TAKE 1 CAPSULE BY MOUTH EVERY DAY 90 capsule 1  . olmesartan (BENICAR) 40 MG tablet TAKE 1 TABLET BY MOUTH EVERY DAY 90 tablet 1  . tadalafil (CIALIS) 20 MG tablet TAKE 1 TABLET BY MOUTH EVERY DAY AS NEEDED 6 tablet 3  . pantoprazole (PROTONIX) 40 MG tablet Take 1 tablet (40 mg total) by mouth daily. 30 tablet 0  . albuterol (VENTOLIN HFA) 108 (90 Base) MCG/ACT inhaler albuterol sulfate HFA 90 mcg/actuation aerosol inhaler  INHALE 2 PUFFS 4 TIMES A DAY AS NEEDED     No facility-administered medications prior to visit.     ROS Review of Systems  Constitutional: Positive for unexpected weight change (wt gain). Negative for diaphoresis and fatigue.  HENT: Negative.   Eyes: Negative.   Respiratory: Negative for cough, chest tightness, shortness of breath and wheezing.   Cardiovascular: Negative for chest pain, palpitations and leg swelling.  Gastrointestinal: Negative for abdominal pain, blood in stool, constipation, diarrhea, nausea and vomiting.  Endocrine: Negative.  Negative for cold intolerance and heat intolerance.  Genitourinary: Negative.  Negative for discharge, penile swelling, scrotal swelling and testicular  pain.  Musculoskeletal: Negative.   Skin: Negative.  Negative for color change, pallor and rash.  Neurological: Positive for numbness. Negative for dizziness, weakness and light-headedness.  Hematological: Negative for adenopathy. Does not bruise/bleed easily.  Psychiatric/Behavioral: Negative.     Objective:  BP 124/76 (BP Location: Left Arm, Patient Position: Sitting, Cuff Size: Large)   Pulse 94   Temp 98.3 F (36.8 C) (Oral)   Resp 16   Ht 6' (1.829 m)   Wt 241 lb 0.6 oz (109.3 kg)   SpO2 97%   [name] 32.69 kg/m   BP Readings from Last 3 Encounters:  04/23/19 124/76  05/29/18 124/78  04/26/18 (!) 164/100    Wt Readings from Last 3 Encounters:  04/23/19 241 lb 0.6 oz (109.3 kg)  05/29/18 238 lb (108 kg)  04/26/18 242 lb (109.8 kg)    Physical Exam Vitals signs reviewed.  Constitutional:      Appearance: Normal appearance. He is obese.  HENT:     Nose: Nose normal.     Mouth/Throat:     Mouth: Mucous membranes are moist.  Eyes:     General: No scleral icterus.    Conjunctiva/sclera: Conjunctivae normal.  Neck:     Musculoskeletal: Neck supple.  Cardiovascular:     Rate and Rhythm: Normal rate and regular rhythm.     Pulses:          Carotid pulses are 1+ on the right side and 1+ on the left side.  Radial pulses are 1+ on the right side and 1+ on the left side.       Femoral pulses are 1+ on the right side and 1+ on the left side.      Popliteal pulses are 1+ on the right side and 1+ on the left side.       Dorsalis pedis pulses are detected w/ Doppler on the right side and detected w/ Doppler on the left side.       Posterior tibial pulses are detected w/ Doppler on the right side and detected w/ Doppler on the left side.     Heart sounds: Normal heart sounds, S1 normal and S2 normal. No murmur.     Comments: The DP and PT pulses in his feet are diminished. Pulmonary:     Effort: Pulmonary effort is normal.     Breath sounds: No stridor. No wheezing,  rhonchi or rales.  Abdominal:     General: Abdomen is protuberant. Bowel sounds are normal. There is no distension.     Palpations: Abdomen is soft. There is no hepatomegaly, splenomegaly or mass.     Tenderness: There is no abdominal tenderness.  Musculoskeletal: Normal range of motion.     Right lower leg: No edema.     Left lower leg: No edema.  Lymphadenopathy:     Cervical: No cervical adenopathy.  Skin:    General: Skin is warm and dry.     Findings: No rash.     Comments: There is mild mottling in his feet, more prominent on the left than the right.  Neurological:     General: No focal deficit present.     Mental Status: He is alert and oriented to person, place, and time. Mental status is at baseline.  Psychiatric:        Mood and Affect: Mood normal.        Behavior: Behavior normal.        Thought Content: Thought content normal.        Judgment: Judgment normal.     Lab Results  Component Value Date   WBC 6.7 04/23/2019   HGB 14.4 04/23/2019   HCT 41.0 04/23/2019   PLT 204.0 04/23/2019   GLUCOSE 106 (H) 04/23/2019   CHOL 238 (H) 04/23/2019   TRIG 123.0 04/23/2019   HDL 42.50 04/23/2019   LDLDIRECT 148.8 12/28/2012   LDLCALC 171 (H) 04/23/2019   ALT 41 04/23/2019   AST 29 04/23/2019   NA 138 04/23/2019   K 4.2 04/23/2019   CL 101 04/23/2019   CREATININE 1.07 04/23/2019   BUN 16 04/23/2019   CO2 30 04/23/2019   TSH 5.68 (H) 04/23/2019   INR 1.0 02/23/2017   HGBA1C 5.1 08/23/2017    No results found.  Assessment & Plan:   Bobby Rodriguez was seen today for annual exam, hypertension, hyperlipidemia and gastroesophageal reflux.  Diagnoses and all orders for this visit:  Essential hypertension, benign- His blood pressure is adequately well controlled.  Electrolytes and renal function are normal. -     CBC with Differential; Future -     Basic metabolic panel; Future -     Hepatic function panel; Future -     Urinalysis, Routine w reflex microscopic; Future   Vitamin D deficiency- His vitamin D level is normal now. -     Vitamin D 25 hydroxy; Future  Routine general medical examination at a health care facility- Exam completed, labs reviewed, he refused a flu vaccine, patient  education was given. -     Lipid panel; Future  TSH elevation- His TSH remains mildly elevated and he is mildly symptomatic.  I recommend that he start taking a thyroid supplement. -     TSH; Future  Lower extremity pain, diffuse, unspecified laterality- He has suspicious symptoms, a mildly elevated D-dimer, and is status post Covid infection.  I have asked him to undergo an arterial ultrasound to see if there is concern for insufficiency or arteritis.  I have also asked him to undergo a venous ultrasound to see if there is concern for venous thrombosis. -     D-dimer, quantitative; Future -     VAS US ABI WITH/WO TBI; Future -     VAS US LOWER EXTREMITY VENOUS (DVT); Future  Numbness and tingling of left leg -     B12; Future  Gastroesophageal reflux disease with esophagitis, unspecified whether hemorrhage -     pantoprazole (PROTONIX) 40 MG tablet; Take 1 tablet (40 mg total) by mouth daily.  Dyslipidemia, goal LDL below 70- He does not have an elevated ASCVD risk score so I did not recommend a statin for CV risk reduction.  Acquired hypothyroidism -     levothyroxine (SYNTHROID) 25 MCG tablet; Take 1 tablet (25 mcg total) by mouth daily before breakfast.  D-dimer, elevated -     VAS US LOWER EXTREMITY VENOUS (DVT); Future   I am having Katherine Roanonley D. Declercq "DEAN" start on levothyroxine. I am also having him maintain his tadalafil, olmesartan, [facility]S D3, albuterol, and pantoprazole.  Meds ordered this encounter  Medications  . pantoprazole (PROTONIX) 40 MG tablet    Sig: Take 1 tablet (40 mg total) by mouth daily.    Dispense:  90 tablet    Refill:  1  . levothyroxine (SYNTHROID) 25 MCG tablet    Sig: Take 1 tablet (25 mcg total) by mouth daily before breakfast.     Dispense:  90 tablet    Refill:  0     Follow-up: Return in about 6 months (around 10/21/2019).  Sanda Lingerhomas Jones, MD

## 2019-04-23 NOTE — Patient Instructions (Signed)

## 2019-04-24 ENCOUNTER — Encounter: Payer: Self-pay | Admitting: Internal Medicine

## 2019-04-24 ENCOUNTER — Telehealth (HOSPITAL_COMMUNITY): Payer: Self-pay | Admitting: *Deleted

## 2019-04-24 DIAGNOSIS — R7989 Other specified abnormal findings of blood chemistry: Secondary | ICD-10-CM | POA: Insufficient documentation

## 2019-04-24 DIAGNOSIS — E039 Hypothyroidism, unspecified: Secondary | ICD-10-CM | POA: Insufficient documentation

## 2019-04-24 LAB — D-DIMER, QUANTITATIVE: D-Dimer, Quant: 0.68 mcg/mL FEU — ABNORMAL HIGH (ref <0.50)

## 2019-04-24 MED ORDER — LEVOTHYROXINE SODIUM 25 MCG PO TABS: 25.0000 ug | ORAL_TABLET | Freq: Every day | ORAL | 0 refills | Status: DC

## 2019-04-24 NOTE — Telephone Encounter (Signed)
Attempting to reach pt to schedule exams requested by Dr. Scarlette Calico.

## 2019-04-27 ENCOUNTER — Encounter (HOSPITAL_COMMUNITY): Payer: BC Managed Care – PPO

## 2019-04-27 ENCOUNTER — Other Ambulatory Visit: Payer: Self-pay

## 2019-04-27 ENCOUNTER — Ambulatory Visit (HOSPITAL_COMMUNITY)
Admission: RE | Admit: 2019-04-27 | Discharge: 2019-04-27 | Disposition: A | Discharge status: Home / Self Care | Payer: BC Managed Care – PPO | Source: Ambulatory Visit | Attending: Internal Medicine | Admitting: Internal Medicine

## 2019-04-27 ENCOUNTER — Telehealth: Payer: Self-pay | Admitting: Internal Medicine

## 2019-04-27 DIAGNOSIS — M79606 Pain in leg, unspecified: Secondary | ICD-10-CM | POA: Diagnosis not present

## 2019-04-27 DIAGNOSIS — R7989 Other specified abnormal findings of blood chemistry: Secondary | ICD-10-CM | POA: Diagnosis not present

## 2019-04-27 NOTE — Telephone Encounter (Signed)
[facility] called regarding VAS [place] LOWER EXTREMITY completed today.  Patient is negative for DVT.

## 2019-04-29 ENCOUNTER — Encounter: Payer: Self-pay | Admitting: Internal Medicine

## 2019-04-30 ENCOUNTER — Other Ambulatory Visit: Payer: Self-pay

## 2019-04-30 ENCOUNTER — Ambulatory Visit (HOSPITAL_COMMUNITY)
Admission: RE | Admit: 2019-04-30 | Discharge: 2019-04-30 | Disposition: A | Discharge status: Home / Self Care | Payer: BC Managed Care – PPO | Source: Ambulatory Visit | Attending: Internal Medicine | Admitting: Internal Medicine

## 2019-04-30 ENCOUNTER — Encounter: Payer: Self-pay | Admitting: Internal Medicine

## 2019-04-30 DIAGNOSIS — M79606 Pain in leg, unspecified: Secondary | ICD-10-CM | POA: Diagnosis not present

## 2019-05-01 ENCOUNTER — Other Ambulatory Visit: Payer: Self-pay | Admitting: Internal Medicine

## 2019-05-01 DIAGNOSIS — E039 Hypothyroidism, unspecified: Secondary | ICD-10-CM

## 2019-05-01 MED ORDER — LEVOTHYROXINE SODIUM 25 MCG PO TABS: 25.0000 ug | ORAL_TABLET | Freq: Every day | ORAL | 0 refills | Status: DC

## 2019-07-27 ENCOUNTER — Encounter: Payer: Self-pay | Admitting: Internal Medicine

## 2019-07-27 DIAGNOSIS — E559 Vitamin D deficiency, unspecified: Secondary | ICD-10-CM

## 2019-07-27 DIAGNOSIS — I10 Essential (primary) hypertension: Secondary | ICD-10-CM

## 2019-07-27 DIAGNOSIS — K2100 Gastro-esophageal reflux disease with esophagitis, without bleeding: Secondary | ICD-10-CM

## 2019-07-27 MED ORDER — PANTOPRAZOLE SODIUM 40 MG PO TBEC: 40.0000 mg | DELAYED_RELEASE_TABLET | Freq: Every day | ORAL | 1 refills | Status: DC

## 2019-07-27 MED ORDER — OLMESARTAN MEDOXOMIL 40 MG PO TABS: 40.0000 mg | ORAL_TABLET | Freq: Every day | ORAL | 1 refills | Status: DC

## 2019-07-30 ENCOUNTER — Other Ambulatory Visit: Payer: Self-pay | Admitting: Internal Medicine

## 2019-07-30 DIAGNOSIS — N522 Drug-induced erectile dysfunction: Secondary | ICD-10-CM

## 2019-07-30 MED ORDER — TADALAFIL 20 MG PO TABS: 20.0000 mg | ORAL_TABLET | Freq: Every day | ORAL | 3 refills | Status: DC | PRN

## 2019-07-30 MED ORDER — CVS D3 50 MCG (2000 UT) PO CAPS: 2000.0000 [IU] | ORAL_CAPSULE | Freq: Every day | ORAL | 1 refills | Status: DC

## 2019-07-30 NOTE — Addendum Note (Signed)
Addended by: Radford Pax M on: 07/30/2019 10:31 AM   Modules accepted: Orders

## 2019-07-30 NOTE — Telephone Encounter (Signed)
Pt is [facility] [address]   Please advise.

## 2019-09-20 DIAGNOSIS — Z20828 Contact with and (suspected) exposure to other viral communicable diseases: Secondary | ICD-10-CM | POA: Diagnosis not present

## 2019-09-20 DIAGNOSIS — Z20822 Contact with and (suspected) exposure to covid-19: Secondary | ICD-10-CM | POA: Diagnosis not present

## 2020-01-17 ENCOUNTER — Other Ambulatory Visit: Payer: Self-pay | Admitting: Internal Medicine

## 2020-01-17 DIAGNOSIS — I10 Essential (primary) hypertension: Secondary | ICD-10-CM

## 2020-01-17 DIAGNOSIS — E559 Vitamin D deficiency, unspecified: Secondary | ICD-10-CM

## 2020-01-17 DIAGNOSIS — N522 Drug-induced erectile dysfunction: Secondary | ICD-10-CM

## 2020-02-07 ENCOUNTER — Encounter: Payer: Self-pay | Admitting: Internal Medicine

## 2020-02-11 ENCOUNTER — Other Ambulatory Visit: Payer: Self-pay | Admitting: Internal Medicine

## 2020-02-11 DIAGNOSIS — E559 Vitamin D deficiency, unspecified: Secondary | ICD-10-CM

## 2020-02-11 DIAGNOSIS — N522 Drug-induced erectile dysfunction: Secondary | ICD-10-CM

## 2020-02-12 ENCOUNTER — Other Ambulatory Visit: Payer: Self-pay | Admitting: Internal Medicine

## 2020-02-12 DIAGNOSIS — I10 Essential (primary) hypertension: Secondary | ICD-10-CM

## 2020-02-25 ENCOUNTER — Encounter: Payer: Self-pay | Admitting: Internal Medicine

## 2020-02-25 ENCOUNTER — Other Ambulatory Visit: Payer: Self-pay

## 2020-02-25 ENCOUNTER — Ambulatory Visit: Payer: BC Managed Care – PPO | Admitting: Internal Medicine

## 2020-02-25 VITALS — BP 138/86 | HR 79 | Temp 98.1°F | Ht 72.0 in | Wt 242.0 lb

## 2020-02-25 DIAGNOSIS — E039 Hypothyroidism, unspecified: Secondary | ICD-10-CM | POA: Diagnosis not present

## 2020-02-25 DIAGNOSIS — I10 Essential (primary) hypertension: Secondary | ICD-10-CM

## 2020-02-25 MED ORDER — OLMESARTAN MEDOXOMIL 40 MG PO TABS: 40.0000 mg | ORAL_TABLET | Freq: Every day | ORAL | 1 refills | Status: DC

## 2020-02-25 NOTE — Patient Instructions (Signed)

## 2020-02-25 NOTE — Progress Notes (Signed)
Subjective:  Patient ID: Bobby Rodriguez, male    DOB: 15-May-1971  Age: 49 y.o. MRN: [id number]  CC: Hypothyroidism and Hypertension  This visit occurred during the SARS-CoV-2 public health emergency.  Safety protocols were in place, including screening questions prior to the visit, additional usage of staff PPE, and extensive cleaning of exam room while observing appropriate contact time as indicated for disinfecting solutions.    HPI Bobby Rodriguez presents for f/up - He has felt well recently and offers no complaints.  He decided not to take the thyroid supplement.  He tells me his blood pressure has been well controlled.  He has been working on his lifestyle modifications.  Outpatient Medications Prior to Visit  Medication Sig Dispense Refill  . [facility] D3 50 MCG (2000 UT) CAPS TAKE 1 CAPSULE BY MOUTH EVERY DAY 90 capsule 1  . pantoprazole (PROTONIX) 40 MG tablet Take 1 tablet (40 mg total) by mouth daily. 90 tablet 1  . tadalafil (CIALIS) 20 MG tablet TAKE 1 TABLET BY MOUTH EVERY DAY AS NEEDED 6 tablet 3  . olmesartan (BENICAR) 40 MG tablet TAKE 1 TABLET BY MOUTH EVERY DAY 90 tablet 0  . albuterol (VENTOLIN HFA) 108 (90 Base) MCG/ACT inhaler albuterol sulfate HFA 90 mcg/actuation aerosol inhaler  INHALE 2 PUFFS 4 TIMES A DAY AS NEEDED (Patient not taking: Reported on 02/25/2020)    . levothyroxine (SYNTHROID) 25 MCG tablet Take 1 tablet (25 mcg total) by mouth daily before breakfast. (Patient not taking: Reported on 02/25/2020) 90 tablet 0   No facility-administered medications prior to visit.    ROS Review of Systems  Constitutional: Negative for appetite change, diaphoresis and fatigue.  HENT: Negative.   Eyes: Negative for visual disturbance.  Respiratory: Negative for apnea, cough, chest tightness, shortness of breath and wheezing.   Cardiovascular: Negative for chest pain, palpitations and leg swelling.  Gastrointestinal: Negative for abdominal pain, constipation, diarrhea,  nausea and vomiting.  Endocrine: Negative.   Genitourinary: Negative.  Negative for difficulty urinating.  Musculoskeletal: Negative for arthralgias and joint swelling.  Skin: Negative.  Negative for color change and pallor.  Neurological: Negative.  Negative for dizziness, weakness and light-headedness.  Hematological: Negative for adenopathy. Does not bruise/bleed easily.  Psychiatric/Behavioral: Negative.     Objective:  BP 138/86   Pulse 79   Temp 98.1 F (36.7 C) (Oral)   Ht 6' (1.829 m)   Wt 242 lb (109.8 kg)   SpO2 96%   [name] 32.82 kg/m   BP Readings from Last 3 Encounters:  02/25/20 138/86  04/23/19 124/76  05/29/18 124/78    Wt Readings from Last 3 Encounters:  02/25/20 242 lb (109.8 kg)  04/23/19 241 lb 0.6 oz (109.3 kg)  05/29/18 238 lb (108 kg)    Physical Exam Vitals reviewed.  HENT:     Nose: Nose normal.     Mouth/Throat:     Mouth: Mucous membranes are moist.  Eyes:     General: No scleral icterus.    Conjunctiva/sclera: Conjunctivae normal.  Cardiovascular:     Rate and Rhythm: Normal rate and regular rhythm.     Heart sounds: No murmur heard.   Pulmonary:     Effort: Pulmonary effort is normal.     Breath sounds: No stridor. No wheezing, rhonchi or rales.  Abdominal:     General: Abdomen is flat. Bowel sounds are normal. There is no distension.     Palpations: Abdomen is soft. There is no hepatomegaly, splenomegaly  or mass.     Tenderness: There is no abdominal tenderness.  Musculoskeletal:        General: Normal range of motion.     Cervical back: Neck supple.     Right lower leg: No edema.     Left lower leg: No edema.  Lymphadenopathy:     Cervical: No cervical adenopathy.  Skin:    General: Skin is warm and dry.     Coloration: Skin is not pale.  Neurological:     General: No focal deficit present.     Mental Status: He is alert.  Psychiatric:        Mood and Affect: Mood normal.        Behavior: Behavior normal.     Lab  Results  Component Value Date   WBC 4.9 02/25/2020   HGB 14.4 02/25/2020   HCT 42.9 02/25/2020   PLT 196 02/25/2020   GLUCOSE 105 (H) 02/25/2020   CHOL 238 (H) 04/23/2019   TRIG 123.0 04/23/2019   HDL 42.50 04/23/2019   LDLDIRECT 148.8 12/28/2012   LDLCALC 171 (H) 04/23/2019   ALT 41 04/23/2019   AST 29 04/23/2019   NA 139 02/25/2020   K 5.2 02/25/2020   CL 101 02/25/2020   CREATININE 1.15 02/25/2020   BUN 14 02/25/2020   CO2 31 02/25/2020   TSH 3.70 02/25/2020   INR 1.0 02/23/2017   HGBA1C 5.1 08/23/2017    VAS [place] ABI WITH/WO TBI  Result Date: 04/30/2019 LOWER EXTREMITY DOPPLER STUDY High Risk Factors: Hypertension, past history of smoking.  Vascular Interventions: Left leg swelling, edema and pain. Comparison Study: No prior study for comparison Performing Technologist: Hardie Lora RVT  Examination Guidelines: A complete evaluation includes at minimum, Doppler waveform signals and systolic blood pressure reading at the level of bilateral brachial, anterior tibial, and posterior tibial arteries, when vessel segments are accessible. Bilateral testing is considered an integral part of a complete examination. Photoelectric Plethysmograph (PPG) waveforms and toe systolic pressure readings are included as required and additional duplex testing as needed. Limited examinations for reoccurring indications may be performed as noted.  ABI Findings: +---------+------------------+-----+---------+--------+ Right    Rt Pressure (mmHg)IndexWaveform Comment  +---------+------------------+-----+---------+--------+ Brachial 137                                      +---------+------------------+-----+---------+--------+ ATA      158               1.15                   +---------+------------------+-----+---------+--------+ PTA      159               1.16 triphasic         +---------+------------------+-----+---------+--------+ DP                              triphasic          +---------+------------------+-----+---------+--------+ Great Toe97                0.71                   +---------+------------------+-----+---------+--------+ +---------+------------------+-----+---------+-------+ Left     Lt Pressure (mmHg)IndexWaveform Comment +---------+------------------+-----+---------+-------+ Brachial 131                                     +---------+------------------+-----+---------+-------+  ATA      157               1.15                  +---------+------------------+-----+---------+-------+ PTA      151               1.10 triphasic        +---------+------------------+-----+---------+-------+ DP                              biphasic         +---------+------------------+-----+---------+-------+ Great Toe85                0.62                  +---------+------------------+-----+---------+-------+ +-------+-----------+-----------+------------+------------+ ABI/TBIToday's ABIToday's TBIPrevious ABIPrevious TBI +-------+-----------+-----------+------------+------------+ Right  1.16       0.71                                +-------+-----------+-----------+------------+------------+ Left   1.15       0.62                                +-------+-----------+-----------+------------+------------+  Summary: Right: Resting right ankle-brachial index is within normal range. No evidence of significant right lower extremity arterial disease. The right toe-brachial index is normal. RT great toe pressure = 97 mmHg. Left: Resting left ankle-brachial index is within normal range. No evidence of significant left lower extremity arterial disease. The left toe-brachial index is abnormal. LT Great toe pressure = 85 mmHg.  *See table(s) above for measurements and observations.  Electronically signed by Coral Else MD on 04/30/2019 at 4:21:36 PM.    Final     Assessment & Plan:   Bobby Rodriguez was seen today for hypothyroidism and  hypertension.  Diagnoses and all orders for this visit:  Essential hypertension, benign- His blood pressure is well controlled.  Electrolytes and renal function are normal.  Will continue the current dose of the ARB. -     CBC with Differential/Platelet; Future -     BASIC METABOLIC PANEL WITH GFR; Future -     olmesartan (BENICAR) 40 MG tablet; Take 1 tablet (40 mg total) by mouth daily. -     BASIC METABOLIC PANEL WITH GFR -     CBC with Differential/Platelet  Acquired hypothyroidism- Clinically he is euthyroid and his TSH is in the normal range on no thyroid replacement therapy. -     TSH; Future -     TSH   I have discontinued Katherine Roan D. Rudnick "DEAN"'s levothyroxine. I have also changed his olmesartan. Additionally, I am having him maintain his albuterol, pantoprazole, tadalafil, and [facility] D3.  Meds ordered this encounter  Medications  . olmesartan (BENICAR) 40 MG tablet    Sig: Take 1 tablet (40 mg total) by mouth daily.    Dispense:  90 tablet    Refill:  1     Follow-up: Return in about 6 months (around 08/24/2020).  Sanda Linger, MD

## 2020-02-26 ENCOUNTER — Other Ambulatory Visit: Payer: Self-pay

## 2020-02-26 DIAGNOSIS — Z1211 Encounter for screening for malignant neoplasm of colon: Secondary | ICD-10-CM

## 2020-02-26 LAB — BASIC METABOLIC PANEL WITH GFR
BUN: 14 mg/dL (ref 7–25)
CO2: 31 mmol/L (ref 20–32)
Calcium: 9.9 mg/dL (ref 8.6–10.3)
Chloride: 101 mmol/L (ref 98–110)
Creat: 1.15 mg/dL (ref 0.60–1.35)
GFR, Est African American: 86 mL/min/{1.73_m2} (ref >=60)
GFR, Est Non African American: 74 mL/min/{1.73_m2} (ref >=60)
Glucose, Bld: 105 mg/dL — ABNORMAL HIGH (ref 65–99)
Potassium: 5.2 mmol/L (ref 3.5–5.3)
Sodium: 139 mmol/L (ref 135–146)

## 2020-02-26 LAB — CBC WITH DIFFERENTIAL/PLATELET
Absolute Monocytes: 573 cells/uL (ref 200–950)
Basophils Absolute: 49 cells/uL (ref 0–200)
Basophils Relative: 1 %
Eosinophils Absolute: 172 cells/uL (ref 15–500)
Eosinophils Relative: 3.5 %
HCT: 42.9 % (ref 38.5–50.0)
Hemoglobin: 14.4 g/dL (ref 13.2–17.1)
Lymphs Abs: 1985 cells/uL (ref 850–3900)
MCH: 32.1 pg (ref 27.0–33.0)
MCHC: 33.6 g/dL (ref 32.0–36.0)
MCV: 95.8 fL (ref 80.0–100.0)
MPV: 9.9 fL (ref 7.5–12.5)
Monocytes Relative: 11.7 %
Neutro Abs: 2122 cells/uL (ref 1500–7800)
Neutrophils Relative %: 43.3 %
Platelets: 196 10*3/uL (ref 140–400)
RBC: 4.48 10*6/uL (ref 4.20–5.80)
RDW: 12.4 % (ref 11.0–15.0)
Total Lymphocyte: 40.5 %
WBC: 4.9 10*3/uL (ref 3.8–10.8)

## 2020-02-26 LAB — TSH: TSH: 3.7 mIU/L (ref 0.40–4.50)

## 2020-03-01 ENCOUNTER — Other Ambulatory Visit: Payer: Self-pay | Admitting: Internal Medicine

## 2020-03-01 DIAGNOSIS — K2100 Gastro-esophageal reflux disease with esophagitis, without bleeding: Secondary | ICD-10-CM

## 2020-05-14 DIAGNOSIS — Z20822 Contact with and (suspected) exposure to covid-19: Secondary | ICD-10-CM | POA: Diagnosis not present

## 2020-06-10 DIAGNOSIS — Z20822 Contact with and (suspected) exposure to covid-19: Secondary | ICD-10-CM | POA: Diagnosis not present

## 2020-07-22 ENCOUNTER — Other Ambulatory Visit: Payer: Self-pay | Admitting: Internal Medicine

## 2020-07-22 DIAGNOSIS — N522 Drug-induced erectile dysfunction: Secondary | ICD-10-CM

## 2020-07-29 ENCOUNTER — Telehealth: Payer: Self-pay

## 2020-07-29 NOTE — Telephone Encounter (Signed)
Pt reminded to complete cologuard kit. Verified that he still has the kit & denies questions at this time. Pt thanked Therapist, sports for reminder.

## 2020-08-17 ENCOUNTER — Other Ambulatory Visit: Payer: Self-pay | Admitting: Internal Medicine

## 2020-08-17 DIAGNOSIS — K2100 Gastro-esophageal reflux disease with esophagitis, without bleeding: Secondary | ICD-10-CM

## 2020-09-11 ENCOUNTER — Encounter: Payer: Self-pay | Admitting: Gastroenterology

## 2020-10-07 DIAGNOSIS — M9905 Segmental and somatic dysfunction of pelvic region: Secondary | ICD-10-CM | POA: Diagnosis not present

## 2020-10-07 DIAGNOSIS — M5416 Radiculopathy, lumbar region: Secondary | ICD-10-CM | POA: Diagnosis not present

## 2020-10-07 DIAGNOSIS — M9903 Segmental and somatic dysfunction of lumbar region: Secondary | ICD-10-CM | POA: Diagnosis not present

## 2020-10-07 DIAGNOSIS — M9904 Segmental and somatic dysfunction of sacral region: Secondary | ICD-10-CM | POA: Diagnosis not present

## 2020-10-16 DIAGNOSIS — M9905 Segmental and somatic dysfunction of pelvic region: Secondary | ICD-10-CM | POA: Diagnosis not present

## 2020-10-16 DIAGNOSIS — M9904 Segmental and somatic dysfunction of sacral region: Secondary | ICD-10-CM | POA: Diagnosis not present

## 2020-10-16 DIAGNOSIS — M5416 Radiculopathy, lumbar region: Secondary | ICD-10-CM | POA: Diagnosis not present

## 2020-10-16 DIAGNOSIS — M9903 Segmental and somatic dysfunction of lumbar region: Secondary | ICD-10-CM | POA: Diagnosis not present

## 2020-10-24 DIAGNOSIS — M5416 Radiculopathy, lumbar region: Secondary | ICD-10-CM | POA: Diagnosis not present

## 2020-10-24 DIAGNOSIS — M9903 Segmental and somatic dysfunction of lumbar region: Secondary | ICD-10-CM | POA: Diagnosis not present

## 2020-10-24 DIAGNOSIS — M9905 Segmental and somatic dysfunction of pelvic region: Secondary | ICD-10-CM | POA: Diagnosis not present

## 2020-10-24 DIAGNOSIS — M9904 Segmental and somatic dysfunction of sacral region: Secondary | ICD-10-CM | POA: Diagnosis not present

## 2020-11-15 ENCOUNTER — Other Ambulatory Visit: Payer: Self-pay | Admitting: Internal Medicine

## 2020-11-15 DIAGNOSIS — I10 Essential (primary) hypertension: Secondary | ICD-10-CM

## 2021-03-12 ENCOUNTER — Encounter: Payer: Self-pay | Admitting: Internal Medicine

## 2021-04-22 ENCOUNTER — Other Ambulatory Visit: Payer: Self-pay

## 2021-04-22 ENCOUNTER — Ambulatory Visit (INDEPENDENT_AMBULATORY_CARE_PROVIDER_SITE_OTHER): Payer: BC Managed Care – PPO | Admitting: Internal Medicine

## 2021-04-22 ENCOUNTER — Encounter: Payer: Self-pay | Admitting: Internal Medicine

## 2021-04-22 VITALS — BP 156/102 | HR 78 | Temp 98.4°F | Resp 16 | Ht 72.0 in | Wt 247.0 lb

## 2021-04-22 DIAGNOSIS — N522 Drug-induced erectile dysfunction: Secondary | ICD-10-CM | POA: Diagnosis not present

## 2021-04-22 DIAGNOSIS — E559 Vitamin D deficiency, unspecified: Secondary | ICD-10-CM | POA: Diagnosis not present

## 2021-04-22 DIAGNOSIS — K760 Fatty (change of) liver, not elsewhere classified: Secondary | ICD-10-CM

## 2021-04-22 DIAGNOSIS — K2100 Gastro-esophageal reflux disease with esophagitis, without bleeding: Secondary | ICD-10-CM

## 2021-04-22 DIAGNOSIS — Z0000 Encounter for general adult medical examination without abnormal findings: Secondary | ICD-10-CM

## 2021-04-22 DIAGNOSIS — Z23 Encounter for immunization: Secondary | ICD-10-CM | POA: Diagnosis not present

## 2021-04-22 DIAGNOSIS — I10 Essential (primary) hypertension: Secondary | ICD-10-CM

## 2021-04-22 DIAGNOSIS — E785 Hyperlipidemia, unspecified: Secondary | ICD-10-CM | POA: Diagnosis not present

## 2021-04-22 DIAGNOSIS — Z0001 Encounter for general adult medical examination with abnormal findings: Secondary | ICD-10-CM | POA: Diagnosis not present

## 2021-04-22 DIAGNOSIS — Z1211 Encounter for screening for malignant neoplasm of colon: Secondary | ICD-10-CM | POA: Insufficient documentation

## 2021-04-22 LAB — LIPID PANEL
Cholesterol: 243 mg/dL — ABNORMAL HIGH (ref 0–200)
HDL: 43.3 mg/dL (ref >39.00)
LDL Cholesterol: 176 mg/dL — ABNORMAL HIGH (ref 0–99)
NonHDL: 199.67
Total CHOL/HDL Ratio: 6
Triglycerides: 116 mg/dL (ref 0.0–149.0)
VLDL: 23.2 mg/dL (ref 0.0–40.0)

## 2021-04-22 LAB — CBC WITH DIFFERENTIAL/PLATELET
Basophils Absolute: 0 10*3/uL (ref 0.0–0.1)
Basophils Relative: 0.6 % (ref 0.0–3.0)
Eosinophils Absolute: 0.2 10*3/uL (ref 0.0–0.7)
Eosinophils Relative: 3.7 % (ref 0.0–5.0)
HCT: 41.8 % (ref 39.0–52.0)
Hemoglobin: 14.6 g/dL (ref 13.0–17.0)
Lymphocytes Relative: 37 % (ref 12.0–46.0)
Lymphs Abs: 2.3 10*3/uL (ref 0.7–4.0)
MCHC: 34.9 g/dL (ref 30.0–36.0)
MCV: 91.3 fl (ref 78.0–100.0)
Monocytes Absolute: 0.7 10*3/uL (ref 0.1–1.0)
Monocytes Relative: 11.1 % (ref 3.0–12.0)
Neutro Abs: 3 10*3/uL (ref 1.4–7.7)
Neutrophils Relative %: 47.6 % (ref 43.0–77.0)
Platelets: 194 10*3/uL (ref 150.0–400.0)
RBC: 4.58 Mil/uL (ref 4.22–5.81)
RDW: 12.9 % (ref 11.5–15.5)
WBC: 6.3 10*3/uL (ref 4.0–10.5)

## 2021-04-22 LAB — BASIC METABOLIC PANEL
BUN: 14 mg/dL (ref 6–23)
CO2: 32 mEq/L (ref 19–32)
Calcium: 9.2 mg/dL (ref 8.4–10.5)
Chloride: 101 mEq/L (ref 96–112)
Creatinine, Ser: 1.07 mg/dL (ref 0.40–1.50)
GFR: 80.68 mL/min (ref >60.00)
Glucose, Bld: 92 mg/dL (ref 70–99)
Potassium: 4.3 mEq/L (ref 3.5–5.1)
Sodium: 138 mEq/L (ref 135–145)

## 2021-04-22 LAB — HEPATIC FUNCTION PANEL
ALT: 86 U/L — ABNORMAL HIGH (ref 0–53)
AST: 52 U/L — ABNORMAL HIGH (ref 0–37)
Albumin: 4.5 g/dL (ref 3.5–5.2)
Alkaline Phosphatase: 39 U/L (ref 39–117)
Bilirubin, Direct: 0.1 mg/dL (ref 0.0–0.3)
Total Bilirubin: 0.8 mg/dL (ref 0.2–1.2)
Total Protein: 6.8 g/dL (ref 6.0–8.3)

## 2021-04-22 LAB — TSH: TSH: 4.8 u[IU]/mL (ref 0.35–5.50)

## 2021-04-22 LAB — VITAMIN D 25 HYDROXY (VIT D DEFICIENCY, FRACTURES): VITD: 29.65 ng/mL — ABNORMAL LOW (ref 30.00–100.00)

## 2021-04-22 LAB — PSA: PSA: 0.93 ng/mL (ref 0.10–4.00)

## 2021-04-22 MED ORDER — TADALAFIL 20 MG PO TABS: 20.0000 mg | ORAL_TABLET | Freq: Every day | ORAL | 3 refills | Status: DC | PRN

## 2021-04-22 MED ORDER — OLMESARTAN MEDOXOMIL 40 MG PO TABS: 40.0000 mg | ORAL_TABLET | Freq: Every day | ORAL | 1 refills | Status: DC

## 2021-04-22 MED ORDER — PANTOPRAZOLE SODIUM 40 MG PO TBEC: 40.0000 mg | DELAYED_RELEASE_TABLET | Freq: Every day | ORAL | 1 refills | Status: DC

## 2021-04-22 NOTE — Patient Instructions (Signed)

## 2021-04-22 NOTE — Progress Notes (Signed)
Subjective:  Patient ID: Bobby Rodriguez, male    DOB: [birth year]  Age: 50 y.o. MRN: [id number]  CC: Annual Exam and Hypertension  This visit occurred during the SARS-CoV-2 public health emergency.  Safety protocols were in place, including screening questions prior to the visit, additional usage of staff PPE, and extensive cleaning of exam room while observing appropriate contact time as indicated for disinfecting solutions.    HPI DEACON GADBOIS presents for f/up -  He has not recently been exercising or taking his antihypertensives.  He does not monitor his blood pressure.  He denies headache, blurred vision, chest pain, shortness of breath, dyspnea on exertion, diaphoresis, edema, or fatigue.  Outpatient Medications Prior to Visit  Medication Sig Dispense Refill   albuterol (VENTOLIN HFA) 108 (90 Base) MCG/ACT inhaler albuterol sulfate HFA 90 mcg/actuation aerosol inhaler  INHALE 2 PUFFS 4 TIMES A DAY AS NEEDED     [facility] D3 50 MCG (2000 UT) CAPS TAKE 1 CAPSULE BY MOUTH EVERY DAY 90 capsule 1   olmesartan (BENICAR) 40 MG tablet Take 1 tablet (40 mg total) by mouth daily. 90 tablet 1   pantoprazole (PROTONIX) 40 MG tablet TAKE 1 TABLET BY MOUTH EVERY DAY 90 tablet 1   tadalafil (CIALIS) 20 MG tablet TAKE 1 TABLET BY MOUTH EVERY DAY AS NEEDED 6 tablet 3   No facility-administered medications prior to visit.    ROS Review of Systems  Constitutional:  Positive for unexpected weight change (wt gain). Negative for appetite change, chills, diaphoresis, fatigue and fever.  HENT: Negative.    Eyes: Negative.   Respiratory:  Negative for apnea, cough, shortness of breath and wheezing.   Cardiovascular:  Negative for chest pain, palpitations and leg swelling.  Gastrointestinal:  Negative for abdominal pain, constipation, diarrhea, nausea and vomiting.  Endocrine: Negative.   Genitourinary: Negative.  Negative for difficulty urinating, dysuria, hematuria, scrotal swelling, testicular pain  and urgency.       ++ED  Musculoskeletal: Negative.  Negative for arthralgias and myalgias.  Skin: Negative.  Negative for color change.  Neurological:  Negative for dizziness, weakness, light-headedness, numbness and headaches.  Hematological:  Negative for adenopathy. Does not bruise/bleed easily.  Psychiatric/Behavioral: Negative.     Objective:  BP (!) 156/102 (BP Location: Right Arm, Patient Position: Sitting, Cuff Size: Large)   Pulse 78   Temp 98.4 F (36.9 C) (Oral)   Resp 16   Ht 6' (1.829 m)   Wt 247 lb (112 kg)   SpO2 96%   [name] 33.50 kg/m   BP Readings from Last 3 Encounters:  04/22/21 (!) 156/102  02/25/20 138/86  04/23/19 124/76    Wt Readings from Last 3 Encounters:  04/22/21 247 lb (112 kg)  02/25/20 242 lb (109.8 kg)  04/23/19 241 lb 0.6 oz (109.3 kg)    Physical Exam Vitals reviewed.  Constitutional:      Appearance: He is obese.  HENT:     Nose: Nose normal.     Mouth/Throat:     Mouth: Mucous membranes are moist.  Eyes:     General: No scleral icterus.    Conjunctiva/sclera: Conjunctivae normal.  Cardiovascular:     Rate and Rhythm: Normal rate and regular rhythm.     Heart sounds: No murmur heard. Pulmonary:     Effort: Pulmonary effort is normal.     Breath sounds: No stridor. No wheezing, rhonchi or rales.  Abdominal:     General: Abdomen is protuberant. Bowel sounds are  normal. There is no distension.     Palpations: Abdomen is soft. There is no hepatomegaly, splenomegaly or mass.     Tenderness: There is no abdominal tenderness. There is no guarding.     Hernia: No hernia is present. There is no hernia in the left inguinal area or right inguinal area.  Genitourinary:    Pubic Area: No rash.      Penis: Normal and circumcised. No discharge.      Testes: Normal.     Epididymis:     Right: Normal.     Left: Normal.     Prostate: Normal. Not enlarged, not tender and no nodules present.     Rectum: Normal. Guaiac result negative. No  mass, tenderness, anal fissure, external hemorrhoid or internal hemorrhoid. Normal anal tone.  Musculoskeletal:        General: Normal range of motion.     Cervical back: Neck supple.     Right lower leg: No edema.     Left lower leg: No edema.  Lymphadenopathy:     Cervical: No cervical adenopathy.     Lower Body: No right inguinal adenopathy. No left inguinal adenopathy.  Skin:    General: Skin is warm and dry.     Coloration: Skin is not pale.  Neurological:     General: No focal deficit present.     Mental Status: He is alert.  Psychiatric:        Mood and Affect: Mood normal.        Behavior: Behavior normal.    Lab Results  Component Value Date   WBC 6.3 04/22/2021   HGB 14.6 04/22/2021   HCT 41.8 04/22/2021   PLT 194.0 04/22/2021   GLUCOSE 92 04/22/2021   CHOL 243 (H) 04/22/2021   TRIG 116.0 04/22/2021   HDL 43.30 04/22/2021   LDLDIRECT 148.8 12/28/2012   LDLCALC 176 (H) 04/22/2021   ALT 86 (H) 04/22/2021   AST 52 (H) 04/22/2021   NA 138 04/22/2021   K 4.3 04/22/2021   CL 101 04/22/2021   CREATININE 1.07 04/22/2021   BUN 14 04/22/2021   CO2 32 04/22/2021   TSH 4.80 04/22/2021   PSA 0.93 04/22/2021   INR 1.0 02/23/2017   HGBA1C 5.1 08/23/2017    VAS [place] ABI WITH/WO TBI  Result Date: 04/30/2019 LOWER EXTREMITY DOPPLER STUDY High Risk Factors: Hypertension, past history of smoking.  Vascular Interventions: Left leg swelling, edema and pain. Comparison Study: No prior study for comparison Performing Technologist: Delorise Shiner RVT  Examination Guidelines: A complete evaluation includes at minimum, Doppler waveform signals and systolic blood pressure reading at the level of bilateral brachial, anterior tibial, and posterior tibial arteries, when vessel segments are accessible. Bilateral testing is considered an integral part of a complete examination. Photoelectric Plethysmograph (PPG) waveforms and toe systolic pressure readings are included as required and  additional duplex testing as needed. Limited examinations for reoccurring indications may be performed as noted.  ABI Findings: +---------+------------------+-----+---------+--------+ Right    Rt Pressure (mmHg)IndexWaveform Comment  +---------+------------------+-----+---------+--------+ Brachial 137                                      +---------+------------------+-----+---------+--------+ ATA      158               1.15                   +---------+------------------+-----+---------+--------+  PTA      159               1.16 triphasic         +---------+------------------+-----+---------+--------+ DP                              triphasic         +---------+------------------+-----+---------+--------+ Great Toe97                0.71                   +---------+------------------+-----+---------+--------+ +---------+------------------+-----+---------+-------+ Left     Lt Pressure (mmHg)IndexWaveform Comment +---------+------------------+-----+---------+-------+ Brachial 131                                     +---------+------------------+-----+---------+-------+ ATA      157               1.15                  +---------+------------------+-----+---------+-------+ PTA      151               1.10 triphasic        +---------+------------------+-----+---------+-------+ DP                              biphasic         +---------+------------------+-----+---------+-------+ Great Toe85                0.62                  +---------+------------------+-----+---------+-------+ +-------+-----------+-----------+------------+------------+ ABI/TBIToday's ABIToday's TBIPrevious ABIPrevious TBI +-------+-----------+-----------+------------+------------+ Right  1.16       0.71                                +-------+-----------+-----------+------------+------------+ Left   1.15       0.62                                 +-------+-----------+-----------+------------+------------+  Summary: Right: Resting right ankle-brachial index is within normal range. No evidence of significant right lower extremity arterial disease. The right toe-brachial index is normal. RT great toe pressure = 97 mmHg. Left: Resting left ankle-brachial index is within normal range. No evidence of significant left lower extremity arterial disease. The left toe-brachial index is abnormal. LT Great toe pressure = 85 mmHg.  *See table(s) above for measurements and observations.  Electronically signed by Harold Barban MD on 04/30/2019 at 4:21:36 PM.    Final     Assessment & Plan:   Bobby Rodriguez was seen today for annual exam and hypertension.  Diagnoses and all orders for this visit:  Fatty liver disease, nonalcoholic- I encouraged him to improve his lifestyle modifications. -     Hepatic function panel; Future -     Hepatic function panel  Essential hypertension, benign- He has not achieved his blood pressure goal of 130/80.  Labs are negative for secondary causes or endorgan damage.  I have asked him to restart the ARB. -     olmesartan (BENICAR) 40 MG tablet; Take 1 tablet (40 mg total) by mouth daily. -  TSH; Future -     Urinalysis, Routine w reflex microscopic; Future -     Basic metabolic panel; Future -     CBC with Differential/Platelet; Future -     VITAMIN D 25 Hydroxy (Vit-D Deficiency, Fractures); Future -     VITAMIN D 25 Hydroxy (Vit-D Deficiency, Fractures) -     CBC with Differential/Platelet -     Basic metabolic panel -     Urinalysis, Routine w reflex microscopic -     TSH  Gastroesophageal reflux disease with esophagitis, unspecified whether hemorrhage- Will continue the PPI. -     pantoprazole (PROTONIX) 40 MG tablet; Take 1 tablet (40 mg total) by mouth daily. -     CBC with Differential/Platelet; Future -     CBC with Differential/Platelet  Drug-induced erectile dysfunction -     tadalafil (CIALIS) 20 MG tablet;  Take 1 tablet (20 mg total) by mouth daily as needed.  Dyslipidemia, goal LDL below 70- Statin tx is not indicated. -     TSH; Future -     Hepatic function panel; Future -     Hepatic function panel -     TSH  Vitamin D deficiency -     VITAMIN D 25 Hydroxy (Vit-D Deficiency, Fractures); Future -     VITAMIN D 25 Hydroxy (Vit-D Deficiency, Fractures) -     Cholecalciferol ([facility] D3) 50 MCG (2000 UT) CAPS; Take 1 capsule (2,000 Units total) by mouth daily.  Encounter for general adult medical examination with abnormal findings- Exam completed, labs reviewed, vaccines reviewed - He refused a flu vaccine, cancer screenings addressed, patient education was given. -     PSA; Future -     Lipid panel; Future -     HIV Antibody (routine testing w rflx); Future -     HIV Antibody (routine testing w rflx) -     Lipid panel -     PSA  Screen for colon cancer -     Cologuard  Routine general medical examination at a health care facility  Other orders -     Varicella-zoster vaccine IM (Shingrix)  I have changed Nanetta Batty D. Qualls "DEAN"'s pantoprazole, tadalafil, and [facility] D3. I am also having him maintain his albuterol and olmesartan.  Meds ordered this encounter  Medications   olmesartan (BENICAR) 40 MG tablet    Sig: Take 1 tablet (40 mg total) by mouth daily.    Dispense:  90 tablet    Refill:  1   pantoprazole (PROTONIX) 40 MG tablet    Sig: Take 1 tablet (40 mg total) by mouth daily.    Dispense:  90 tablet    Refill:  1   tadalafil (CIALIS) 20 MG tablet    Sig: Take 1 tablet (20 mg total) by mouth daily as needed.    Dispense:  6 tablet    Refill:  3   Cholecalciferol ([facility] D3) 50 MCG (2000 UT) CAPS    Sig: Take 1 capsule (2,000 Units total) by mouth daily.    Dispense:  90 capsule    Refill:  1     Follow-up: Return in about 3 months (around 07/23/2021).  Scarlette Calico, MD

## 2021-04-23 LAB — URINALYSIS, ROUTINE W REFLEX MICROSCOPIC
Bilirubin Urine: NEGATIVE
Hgb urine dipstick: NEGATIVE
Ketones, ur: NEGATIVE
Leukocytes,Ua: NEGATIVE
Nitrite: NEGATIVE
RBC / HPF: NONE SEEN (ref 0–?)
Specific Gravity, Urine: 1.02 (ref 1.000–1.030)
Total Protein, Urine: NEGATIVE
Urine Glucose: NEGATIVE
Urobilinogen, UA: 0.2 (ref 0.0–1.0)
pH: 6.5 (ref 5.0–8.0)

## 2021-04-23 MED ORDER — CVS D3 50 MCG (2000 UT) PO CAPS: 2000.0000 [IU] | ORAL_CAPSULE | Freq: Every day | ORAL | 1 refills | Status: DC

## 2021-04-30 ENCOUNTER — Other Ambulatory Visit: Payer: Self-pay | Admitting: Internal Medicine

## 2021-05-14 NOTE — Addendum Note (Signed)
Addended by: Waldemar Dickens B on: 05/14/2021 02:13 PM   Modules accepted: Orders

## 2021-07-21 ENCOUNTER — Encounter: Payer: Self-pay | Admitting: Internal Medicine

## 2021-07-22 ENCOUNTER — Other Ambulatory Visit: Payer: Self-pay | Admitting: Internal Medicine

## 2021-07-22 DIAGNOSIS — I10 Essential (primary) hypertension: Secondary | ICD-10-CM

## 2021-07-22 DIAGNOSIS — K2100 Gastro-esophageal reflux disease with esophagitis, without bleeding: Secondary | ICD-10-CM

## 2021-07-22 MED ORDER — PANTOPRAZOLE SODIUM 40 MG PO TBEC: 40.0000 mg | DELAYED_RELEASE_TABLET | Freq: Every day | ORAL | 0 refills | Status: DC

## 2021-07-22 MED ORDER — OLMESARTAN MEDOXOMIL 40 MG PO TABS: 40.0000 mg | ORAL_TABLET | Freq: Every day | ORAL | 0 refills | Status: DC

## 2021-07-22 NOTE — Telephone Encounter (Signed)
Please advise 

## 2021-07-22 NOTE — Telephone Encounter (Signed)
Please advise. Patient states his meds is on hold at [facility]. He is out of refills. Patient has an appt with you on 09/09/21. Thank you

## 2021-07-27 ENCOUNTER — Ambulatory Visit: Payer: BC Managed Care – PPO | Admitting: Internal Medicine

## 2021-09-09 ENCOUNTER — Encounter: Payer: Self-pay | Admitting: Internal Medicine

## 2021-09-09 ENCOUNTER — Ambulatory Visit: Payer: BC Managed Care – PPO | Admitting: Internal Medicine

## 2021-09-09 ENCOUNTER — Other Ambulatory Visit: Payer: Self-pay | Admitting: Internal Medicine

## 2021-09-09 VITALS — BP 132/78 | HR 89 | Temp 98.0°F | Resp 16 | Ht 72.0 in | Wt 250.0 lb

## 2021-09-09 DIAGNOSIS — Z23 Encounter for immunization: Secondary | ICD-10-CM

## 2021-09-09 DIAGNOSIS — I10 Essential (primary) hypertension: Secondary | ICD-10-CM | POA: Diagnosis not present

## 2021-09-09 DIAGNOSIS — K760 Fatty (change of) liver, not elsewhere classified: Secondary | ICD-10-CM

## 2021-09-09 DIAGNOSIS — J4520 Mild intermittent asthma, uncomplicated: Secondary | ICD-10-CM

## 2021-09-09 DIAGNOSIS — J45990 Exercise induced bronchospasm: Secondary | ICD-10-CM | POA: Diagnosis not present

## 2021-09-09 DIAGNOSIS — Z0001 Encounter for general adult medical examination with abnormal findings: Secondary | ICD-10-CM

## 2021-09-09 DIAGNOSIS — Z1211 Encounter for screening for malignant neoplasm of colon: Secondary | ICD-10-CM

## 2021-09-09 LAB — BASIC METABOLIC PANEL
BUN: 14 mg/dL (ref 6–23)
CO2: 31 mEq/L (ref 19–32)
Calcium: 9.6 mg/dL (ref 8.4–10.5)
Chloride: 102 mEq/L (ref 96–112)
Creatinine, Ser: 1.2 mg/dL (ref 0.40–1.50)
GFR: 70.12 mL/min (ref >60.00)
Glucose, Bld: 98 mg/dL (ref 70–99)
Potassium: 3.9 mEq/L (ref 3.5–5.1)
Sodium: 139 mEq/L (ref 135–145)

## 2021-09-09 LAB — HEPATIC FUNCTION PANEL
ALT: 99 U/L — ABNORMAL HIGH (ref 0–53)
AST: 61 U/L — ABNORMAL HIGH (ref 0–37)
Albumin: 4.5 g/dL (ref 3.5–5.2)
Alkaline Phosphatase: 37 U/L — ABNORMAL LOW (ref 39–117)
Bilirubin, Direct: 0.1 mg/dL (ref 0.0–0.3)
Total Bilirubin: 0.7 mg/dL (ref 0.2–1.2)
Total Protein: 7 g/dL (ref 6.0–8.3)

## 2021-09-09 MED ORDER — ALBUTEROL SULFATE HFA 108 (90 BASE) MCG/ACT IN AERS: 2.0000 | INHALATION_SPRAY | Freq: Four times a day (QID) | RESPIRATORY_TRACT | 3 refills | Status: DC | PRN

## 2021-09-09 MED ORDER — LEVALBUTEROL TARTRATE 45 MCG/ACT IN AERO: 1.0000 | INHALATION_SPRAY | Freq: Four times a day (QID) | RESPIRATORY_TRACT | 3 refills | Status: DC | PRN

## 2021-09-09 NOTE — Progress Notes (Signed)
eia ? ?Subjective:  ?Patient ID: Bobby Rodriguez, male    DOB: 1970/07/04  Age: 51 y.o. MRN: [id number] ? ?CC: Hypertension ? ?This visit occurred during the SARS-CoV-2 public health emergency.  Safety protocols were in place, including screening questions prior to the visit, additional usage of staff PPE, and extensive cleaning of exam room while observing appropriate contact time as indicated for disinfecting solutions.   ? ?HPI ?Bobby Rodriguez presents for f/up - ? ?He works out on walks about a mile a day.  He does not experience chest pain, shortness of breath, diaphoresis, dizziness, lightheadedness, or edema. ? ?Outpatient Medications Prior to Visit  ?Medication Sig Dispense Refill  ? Cholecalciferol ([facility] D3) 50 MCG (2000 UT) CAPS Take 1 capsule (2,000 Units total) by mouth daily. 90 capsule 1  ? olmesartan (BENICAR) 40 MG tablet Take 1 tablet (40 mg total) by mouth daily. 90 tablet 0  ? pantoprazole (PROTONIX) 40 MG tablet Take 1 tablet (40 mg total) by mouth daily. 90 tablet 0  ? tadalafil (CIALIS) 20 MG tablet Take 1 tablet (20 mg total) by mouth daily as needed. 6 tablet 3  ? albuterol (VENTOLIN HFA) 108 (90 Base) MCG/ACT inhaler albuterol sulfate HFA 90 mcg/actuation aerosol inhaler ? INHALE 2 PUFFS 4 TIMES A DAY AS NEEDED    ? ?No facility-administered medications prior to visit.  ? ? ?ROS ?Review of Systems  ?Constitutional:  Positive for unexpected weight change (wt gain). Negative for chills, diaphoresis and fatigue.  ?HENT: Negative.    ?Eyes: Negative.  Negative for visual disturbance.  ?Respiratory:  Positive for wheezing. Negative for cough, chest tightness and shortness of breath.   ?     He has rare wheezing related to allergies.  ?Cardiovascular:  Negative for chest pain, palpitations and leg swelling.  ?Gastrointestinal:  Negative for abdominal pain, constipation, diarrhea, nausea and vomiting.  ?Endocrine: Negative.   ?Genitourinary: Negative.  Negative for difficulty urinating.   ?Musculoskeletal: Negative.   ?Neurological: Negative.  Negative for dizziness, weakness and headaches.  ?Hematological:  Negative for adenopathy. Does not bruise/bleed easily.  ?Psychiatric/Behavioral: Negative.    ? ?Objective:  ?BP 132/78 (BP Location: Right Arm, Patient Position: Sitting, Cuff Size: Large)   Pulse 89   Temp 98 ?F (36.7 ?C) (Oral)   Resp 16   Ht 6' (1.829 m)   Wt 250 lb (113.4 kg)   SpO2 97%   [name] 33.91 kg/m?  ? ?BP Readings from Last 3 Encounters:  ?09/09/21 132/78  ?04/22/21 (!) 156/102  ?02/25/20 138/86  ? ? ?Wt Readings from Last 3 Encounters:  ?09/09/21 250 lb (113.4 kg)  ?04/22/21 247 lb (112 kg)  ?02/25/20 242 lb (109.8 kg)  ? ? ?Physical Exam ?Vitals reviewed.  ?Constitutional:   ?   Appearance: He is obese. He is not ill-appearing.  ?HENT:  ?   Nose: Nose normal.  ?   Mouth/Throat:  ?   Mouth: Mucous membranes are moist.  ?Eyes:  ?   General: No scleral icterus. ?   Conjunctiva/sclera: Conjunctivae normal.  ?Cardiovascular:  ?   Rate and Rhythm: Normal rate and regular rhythm.  ?   Heart sounds: No murmur heard. ?Pulmonary:  ?   Effort: Pulmonary effort is normal.  ?   Breath sounds: No stridor. No wheezing, rhonchi or rales.  ?Abdominal:  ?   General: Abdomen is flat.  ?   Palpations: There is no mass.  ?   Tenderness: There is no abdominal tenderness. There  is no guarding.  ?   Hernia: No hernia is present.  ?Musculoskeletal:     ?   General: Normal range of motion.  ?   Cervical back: Neck supple.  ?   Right lower leg: No edema.  ?   Left lower leg: No edema.  ?Lymphadenopathy:  ?   Cervical: No cervical adenopathy.  ?Skin: ?   General: Skin is warm and dry.  ?Neurological:  ?   General: No focal deficit present.  ?   Mental Status: He is alert.  ?Psychiatric:     ?   Mood and Affect: Mood normal.     ?   Behavior: Behavior normal.  ? ? ?Lab Results  ?Component Value Date  ? WBC 6.3 04/22/2021  ? HGB 14.6 04/22/2021  ? HCT 41.8 04/22/2021  ? PLT 194.0 04/22/2021  ? GLUCOSE 92  04/22/2021  ? CHOL 243 (H) 04/22/2021  ? TRIG 116.0 04/22/2021  ? HDL 43.30 04/22/2021  ? LDLDIRECT 148.8 12/28/2012  ? LDLCALC 176 (H) 04/22/2021  ? ALT 86 (H) 04/22/2021  ? AST 52 (H) 04/22/2021  ? NA 138 04/22/2021  ? K 4.3 04/22/2021  ? CL 101 04/22/2021  ? CREATININE 1.07 04/22/2021  ? BUN 14 04/22/2021  ? CO2 32 04/22/2021  ? TSH 4.80 04/22/2021  ? PSA 0.93 04/22/2021  ? INR 1.0 02/23/2017  ? HGBA1C 5.1 08/23/2017  ? ? ?VAS [place] ABI WITH/WO TBI ? ?Result Date: 04/30/2019 ?LOWER EXTREMITY DOPPLER STUDY High Risk Factors: Hypertension, past history of smoking.  Vascular Interventions: Left leg swelling, edema and pain. Comparison Study: No prior study for comparison Performing Technologist: Delorise Shiner RVT  Examination Guidelines: A complete evaluation includes at minimum, Doppler waveform signals and systolic blood pressure reading at the level of bilateral brachial, anterior tibial, and posterior tibial arteries, when vessel segments are accessible. Bilateral testing is considered an integral part of a complete examination. Photoelectric Plethysmograph (PPG) waveforms and toe systolic pressure readings are included as required and additional duplex testing as needed. Limited examinations for reoccurring indications may be performed as noted.  ABI Findings: +---------+------------------+-----+---------+--------+ Right    Rt Pressure (mmHg)IndexWaveform Comment  +---------+------------------+-----+---------+--------+ Brachial 137                                      +---------+------------------+-----+---------+--------+ ATA      158               1.15                   +---------+------------------+-----+---------+--------+ PTA      159               1.16 triphasic         +---------+------------------+-----+---------+--------+ DP                              triphasic         +---------+------------------+-----+---------+--------+ Great Toe97                0.71                    +---------+------------------+-----+---------+--------+ +---------+------------------+-----+---------+-------+ Left     Lt Pressure (mmHg)IndexWaveform Comment +---------+------------------+-----+---------+-------+ Brachial 131                                     +---------+------------------+-----+---------+-------+  ATA      157               1.15                  +---------+------------------+-----+---------+-------+ PTA      151               1.10 triphasic        +---------+------------------+-----+---------+-------+ DP                              biphasic         +---------+------------------+-----+---------+-------+ Great Toe85                0.62                  +---------+------------------+-----+---------+-------+ +-------+-----------+-----------+------------+------------+ ABI/TBIToday's ABIToday's TBIPrevious ABIPrevious TBI +-------+-----------+-----------+------------+------------+ Right  1.16       0.71                                +-------+-----------+-----------+------------+------------+ Left   1.15       0.62                                +-------+-----------+-----------+------------+------------+  Summary: Right: Resting right ankle-brachial index is within normal range. No evidence of significant right lower extremity arterial disease. The right toe-brachial index is normal. RT great toe pressure = 97 mmHg. Left: Resting left ankle-brachial index is within normal range. No evidence of significant left lower extremity arterial disease. The left toe-brachial index is abnormal. LT Great toe pressure = 85 mmHg.  *See table(s) above for measurements and observations.  Electronically signed by Harold Barban MD on 04/30/2019 at 4:21:36 PM.    Final   ? ? ?Assessment & Plan:  ? ?Bobby Rodriguez was seen today for hypertension. ? ?Diagnoses and all orders for this visit: ? ?Essential hypertension, benign- His blood pressure is adequately well  controlled. ?-     Basic metabolic panel; Future ?-     Basic metabolic panel ? ?Fatty liver disease, nonalcoholic- His LFTs remain elevated.  I have asked him to improve his lifestyle modifications. ?-     Hepatic function panel; Future ?-     Hepatic function panel ? ?Screen for colon cancer ?-     Cologuard ? ?Ex

## 2021-09-09 NOTE — Patient Instructions (Signed)

## 2021-09-09 NOTE — Progress Notes (Unsigned)
? ?Subjective:  ?Patient ID: Bobby Rodriguez, male    DOB: 1970/10/15  Age: 51 y.o. MRN: [id number] ? ?CC: No chief complaint on file. ? ? ?HPI ?Shirline Frees presents for *** ? ?Outpatient Medications Prior to Visit  ?Medication Sig Dispense Refill  ? Cholecalciferol ([facility] D3) 50 MCG (2000 UT) CAPS Take 1 capsule (2,000 Units total) by mouth daily. 90 capsule 1  ? olmesartan (BENICAR) 40 MG tablet Take 1 tablet (40 mg total) by mouth daily. 90 tablet 0  ? pantoprazole (PROTONIX) 40 MG tablet Take 1 tablet (40 mg total) by mouth daily. 90 tablet 0  ? tadalafil (CIALIS) 20 MG tablet Take 1 tablet (20 mg total) by mouth daily as needed. 6 tablet 3  ? albuterol (VENTOLIN HFA) 108 (90 Base) MCG/ACT inhaler Inhale 2 puffs into the lungs every 6 (six) hours as needed for wheezing or shortness of breath. 18 g 3  ? ?No facility-administered medications prior to visit.  ? ? ?ROS ?Review of Systems ? ?Objective:  ?There were no vitals taken for this visit. ? ?BP Readings from Last 3 Encounters:  ?09/09/21 132/78  ?04/22/21 (!) 156/102  ?02/25/20 138/86  ? ? ?Wt Readings from Last 3 Encounters:  ?09/09/21 250 lb (113.4 kg)  ?04/22/21 247 lb (112 kg)  ?02/25/20 242 lb (109.8 kg)  ? ? ?Physical Exam ? ?Lab Results  ?Component Value Date  ? WBC 6.3 04/22/2021  ? HGB 14.6 04/22/2021  ? HCT 41.8 04/22/2021  ? PLT 194.0 04/22/2021  ? GLUCOSE 92 04/22/2021  ? CHOL 243 (H) 04/22/2021  ? TRIG 116.0 04/22/2021  ? HDL 43.30 04/22/2021  ? LDLDIRECT 148.8 12/28/2012  ? LDLCALC 176 (H) 04/22/2021  ? ALT 86 (H) 04/22/2021  ? AST 52 (H) 04/22/2021  ? NA 138 04/22/2021  ? K 4.3 04/22/2021  ? CL 101 04/22/2021  ? CREATININE 1.07 04/22/2021  ? BUN 14 04/22/2021  ? CO2 32 04/22/2021  ? TSH 4.80 04/22/2021  ? PSA 0.93 04/22/2021  ? INR 1.0 02/23/2017  ? HGBA1C 5.1 08/23/2017  ? ? ?VAS [place] ABI WITH/WO TBI ? ?Result Date: 04/30/2019 ?LOWER EXTREMITY DOPPLER STUDY High Risk Factors: Hypertension, past history of smoking.  Vascular Interventions: Left  leg swelling, edema and pain. Comparison Study: No prior study for comparison Performing Technologist: Delorise Shiner RVT  Examination Guidelines: A complete evaluation includes at minimum, Doppler waveform signals and systolic blood pressure reading at the level of bilateral brachial, anterior tibial, and posterior tibial arteries, when vessel segments are accessible. Bilateral testing is considered an integral part of a complete examination. Photoelectric Plethysmograph (PPG) waveforms and toe systolic pressure readings are included as required and additional duplex testing as needed. Limited examinations for reoccurring indications may be performed as noted.  ABI Findings: +---------+------------------+-----+---------+--------+ Right    Rt Pressure (mmHg)IndexWaveform Comment  +---------+------------------+-----+---------+--------+ Brachial 137                                      +---------+------------------+-----+---------+--------+ ATA      158               1.15                   +---------+------------------+-----+---------+--------+ PTA      159               1.16 triphasic         +---------+------------------+-----+---------+--------+  DP                              triphasic         +---------+------------------+-----+---------+--------+ Great Toe97                0.71                   +---------+------------------+-----+---------+--------+ +---------+------------------+-----+---------+-------+ Left     Lt Pressure (mmHg)IndexWaveform Comment +---------+------------------+-----+---------+-------+ Brachial 131                                     +---------+------------------+-----+---------+-------+ ATA      157               1.15                  +---------+------------------+-----+---------+-------+ PTA      151               1.10 triphasic        +---------+------------------+-----+---------+-------+ DP                               biphasic         +---------+------------------+-----+---------+-------+ Great Toe85                0.62                  +---------+------------------+-----+---------+-------+ +-------+-----------+-----------+------------+------------+ ABI/TBIToday's ABIToday's TBIPrevious ABIPrevious TBI +-------+-----------+-----------+------------+------------+ Right  1.16       0.71                                +-------+-----------+-----------+------------+------------+ Left   1.15       0.62                                +-------+-----------+-----------+------------+------------+  Summary: Right: Resting right ankle-brachial index is within normal range. No evidence of significant right lower extremity arterial disease. The right toe-brachial index is normal. RT great toe pressure = 97 mmHg. Left: Resting left ankle-brachial index is within normal range. No evidence of significant left lower extremity arterial disease. The left toe-brachial index is abnormal. LT Great toe pressure = 85 mmHg.  *See table(s) above for measurements and observations.  Electronically signed by Harold Barban MD on 04/30/2019 at 4:21:36 PM.    Final   ? ? ?Assessment & Plan:  ? ?Diagnoses and all orders for this visit: ? ?Mild intermittent asthma without complication ?-     levalbuterol (XOPENEX HFA) 45 MCG/ACT inhaler; Inhale 1 puff into the lungs every 6 (six) hours as needed for wheezing. ? ? ?I have discontinued Sanath Sane. [place] "DEAN"'s albuterol. I am also having him start on levalbuterol. Additionally, I am having him maintain his tadalafil, [facility]S D3, pantoprazole, and olmesartan. ? ?Meds ordered this encounter  ?Medications  ? levalbuterol (XOPENEX HFA) 45 MCG/ACT inhaler  ?  Sig: Inhale 1 puff into the lungs every 6 (six) hours as needed for wheezing.  ?  Dispense:  15 g  ?  Refill:  3  ? ? ? ?Follow-up: No follow-ups on file. ? ?Scarlette Calico, MD ?

## 2021-09-10 LAB — HIV ANTIBODY (ROUTINE TESTING W REFLEX): HIV 1&2 Ab, 4th Generation: NONREACTIVE

## 2021-10-05 ENCOUNTER — Other Ambulatory Visit: Payer: Self-pay | Admitting: Internal Medicine

## 2021-10-05 DIAGNOSIS — N522 Drug-induced erectile dysfunction: Secondary | ICD-10-CM

## 2021-10-15 ENCOUNTER — Other Ambulatory Visit: Payer: Self-pay | Admitting: Internal Medicine

## 2021-10-15 DIAGNOSIS — E559 Vitamin D deficiency, unspecified: Secondary | ICD-10-CM

## 2021-11-08 DIAGNOSIS — J22 Unspecified acute lower respiratory infection: Secondary | ICD-10-CM | POA: Diagnosis not present

## 2021-12-24 ENCOUNTER — Other Ambulatory Visit: Payer: Self-pay | Admitting: Internal Medicine

## 2021-12-24 DIAGNOSIS — N522 Drug-induced erectile dysfunction: Secondary | ICD-10-CM

## 2022-01-06 ENCOUNTER — Other Ambulatory Visit: Payer: Self-pay | Admitting: Internal Medicine

## 2022-01-06 DIAGNOSIS — K2100 Gastro-esophageal reflux disease with esophagitis, without bleeding: Secondary | ICD-10-CM

## 2022-01-17 ENCOUNTER — Other Ambulatory Visit: Payer: Self-pay | Admitting: Internal Medicine

## 2022-01-17 DIAGNOSIS — I10 Essential (primary) hypertension: Secondary | ICD-10-CM

## 2022-03-02 ENCOUNTER — Other Ambulatory Visit: Payer: Self-pay | Admitting: Internal Medicine

## 2022-03-02 DIAGNOSIS — N522 Drug-induced erectile dysfunction: Secondary | ICD-10-CM

## 2022-03-23 DIAGNOSIS — B079 Viral wart, unspecified: Secondary | ICD-10-CM | POA: Diagnosis not present

## 2022-03-23 DIAGNOSIS — L918 Other hypertrophic disorders of the skin: Secondary | ICD-10-CM | POA: Diagnosis not present

## 2022-03-23 DIAGNOSIS — L821 Other seborrheic keratosis: Secondary | ICD-10-CM | POA: Diagnosis not present

## 2022-04-06 ENCOUNTER — Other Ambulatory Visit: Payer: Self-pay | Admitting: Internal Medicine

## 2022-04-06 DIAGNOSIS — K2100 Gastro-esophageal reflux disease with esophagitis, without bleeding: Secondary | ICD-10-CM

## 2022-04-09 ENCOUNTER — Other Ambulatory Visit: Payer: Self-pay | Admitting: Internal Medicine

## 2022-04-09 DIAGNOSIS — E559 Vitamin D deficiency, unspecified: Secondary | ICD-10-CM

## 2022-04-09 DIAGNOSIS — I10 Essential (primary) hypertension: Secondary | ICD-10-CM

## 2022-05-04 DIAGNOSIS — B079 Viral wart, unspecified: Secondary | ICD-10-CM | POA: Diagnosis not present

## 2022-05-09 ENCOUNTER — Other Ambulatory Visit: Payer: Self-pay | Admitting: Internal Medicine

## 2022-05-09 DIAGNOSIS — N522 Drug-induced erectile dysfunction: Secondary | ICD-10-CM

## 2022-06-21 DIAGNOSIS — B079 Viral wart, unspecified: Secondary | ICD-10-CM | POA: Diagnosis not present

## 2022-07-02 ENCOUNTER — Telehealth: Payer: Self-pay | Admitting: Internal Medicine

## 2022-07-02 ENCOUNTER — Other Ambulatory Visit: Payer: Self-pay | Admitting: Internal Medicine

## 2022-07-02 DIAGNOSIS — N522 Drug-induced erectile dysfunction: Secondary | ICD-10-CM

## 2022-07-02 DIAGNOSIS — K2100 Gastro-esophageal reflux disease with esophagitis, without bleeding: Secondary | ICD-10-CM

## 2022-07-02 DIAGNOSIS — I10 Essential (primary) hypertension: Secondary | ICD-10-CM

## 2022-07-02 MED ORDER — OLMESARTAN MEDOXOMIL 40 MG PO TABS: 40.0000 mg | ORAL_TABLET | Freq: Every day | ORAL | 0 refills | Status: DC

## 2022-07-02 MED ORDER — PANTOPRAZOLE SODIUM 40 MG PO TBEC: 40.0000 mg | DELAYED_RELEASE_TABLET | Freq: Every day | ORAL | 0 refills | Status: DC

## 2022-07-02 MED ORDER — TADALAFIL 20 MG PO TABS: 20.0000 mg | ORAL_TABLET | Freq: Every day | ORAL | 1 refills | Status: DC | PRN

## 2022-07-02 NOTE — Telephone Encounter (Signed)
Caller & Relationship to patient: PT  Call back number: [phone]  Date of last office visit: 09/09/21  Date of next office visit: 08/23/2022  Medication(s) to be refilled:  olmesartan (BENICAR) 40 MG tablet   pantoprazole (PROTONIX) 40 MG tablet   tadalafil (CIALIS) 20 MG tablet   Preferred Pharmacy:   [id number] - [place], [place]

## 2022-07-05 ENCOUNTER — Other Ambulatory Visit: Payer: Self-pay | Admitting: Internal Medicine

## 2022-07-05 DIAGNOSIS — K2100 Gastro-esophageal reflux disease with esophagitis, without bleeding: Secondary | ICD-10-CM

## 2022-07-29 DIAGNOSIS — J019 Acute sinusitis, unspecified: Secondary | ICD-10-CM | POA: Diagnosis not present

## 2022-07-29 DIAGNOSIS — B9689 Other specified bacterial agents as the cause of diseases classified elsewhere: Secondary | ICD-10-CM | POA: Diagnosis not present

## 2022-08-23 ENCOUNTER — Ambulatory Visit (INDEPENDENT_AMBULATORY_CARE_PROVIDER_SITE_OTHER): Payer: BC Managed Care – PPO | Admitting: Internal Medicine

## 2022-08-23 ENCOUNTER — Encounter: Payer: Self-pay | Admitting: Internal Medicine

## 2022-08-23 ENCOUNTER — Encounter: Payer: Self-pay | Admitting: Gastroenterology

## 2022-08-23 VITALS — BP 130/76 | HR 91 | Temp 98.1°F | Resp 16 | Ht 72.0 in | Wt 252.0 lb

## 2022-08-23 DIAGNOSIS — Z1211 Encounter for screening for malignant neoplasm of colon: Secondary | ICD-10-CM

## 2022-08-23 DIAGNOSIS — K760 Fatty (change of) liver, not elsewhere classified: Secondary | ICD-10-CM | POA: Diagnosis not present

## 2022-08-23 DIAGNOSIS — E785 Hyperlipidemia, unspecified: Secondary | ICD-10-CM

## 2022-08-23 DIAGNOSIS — J3089 Other allergic rhinitis: Secondary | ICD-10-CM

## 2022-08-23 DIAGNOSIS — K2100 Gastro-esophageal reflux disease with esophagitis, without bleeding: Secondary | ICD-10-CM

## 2022-08-23 DIAGNOSIS — E039 Hypothyroidism, unspecified: Secondary | ICD-10-CM | POA: Diagnosis not present

## 2022-08-23 DIAGNOSIS — J309 Allergic rhinitis, unspecified: Secondary | ICD-10-CM | POA: Insufficient documentation

## 2022-08-23 DIAGNOSIS — J340 Abscess, furuncle and carbuncle of nose: Secondary | ICD-10-CM

## 2022-08-23 DIAGNOSIS — J4520 Mild intermittent asthma, uncomplicated: Secondary | ICD-10-CM | POA: Diagnosis not present

## 2022-08-23 DIAGNOSIS — I10 Essential (primary) hypertension: Secondary | ICD-10-CM

## 2022-08-23 DIAGNOSIS — J0100 Acute maxillary sinusitis, unspecified: Secondary | ICD-10-CM

## 2022-08-23 DIAGNOSIS — Z0001 Encounter for general adult medical examination with abnormal findings: Secondary | ICD-10-CM

## 2022-08-23 LAB — CBC WITH DIFFERENTIAL/PLATELET
Basophils Absolute: 0 10*3/uL (ref 0.0–0.1)
Basophils Relative: 0.7 % (ref 0.0–3.0)
Eosinophils Absolute: 0.2 10*3/uL (ref 0.0–0.7)
Eosinophils Relative: 3.9 % (ref 0.0–5.0)
HCT: 42.1 % (ref 39.0–52.0)
Hemoglobin: 14.6 g/dL (ref 13.0–17.0)
Lymphocytes Relative: 36.5 % (ref 12.0–46.0)
Lymphs Abs: 2.1 10*3/uL (ref 0.7–4.0)
MCHC: 34.6 g/dL (ref 30.0–36.0)
MCV: 92.9 fl (ref 78.0–100.0)
Monocytes Absolute: 0.6 10*3/uL (ref 0.1–1.0)
Monocytes Relative: 10.4 % (ref 3.0–12.0)
Neutro Abs: 2.8 10*3/uL (ref 1.4–7.7)
Neutrophils Relative %: 48.5 % (ref 43.0–77.0)
Platelets: 177 10*3/uL (ref 150.0–400.0)
RBC: 4.53 Mil/uL (ref 4.22–5.81)
RDW: 12.9 % (ref 11.5–15.5)
WBC: 5.8 10*3/uL (ref 4.0–10.5)

## 2022-08-23 LAB — LIPID PANEL
Cholesterol: 239 mg/dL — ABNORMAL HIGH (ref 0–200)
HDL: 50.8 mg/dL (ref >39.00)
LDL Cholesterol: 172 mg/dL — ABNORMAL HIGH (ref 0–99)
NonHDL: 188.02
Total CHOL/HDL Ratio: 5
Triglycerides: 79 mg/dL (ref 0.0–149.0)
VLDL: 15.8 mg/dL (ref 0.0–40.0)

## 2022-08-23 LAB — HEPATIC FUNCTION PANEL
ALT: 64 U/L — ABNORMAL HIGH (ref 0–53)
AST: 65 U/L — ABNORMAL HIGH (ref 0–37)
Albumin: 4.3 g/dL (ref 3.5–5.2)
Alkaline Phosphatase: 28 U/L — ABNORMAL LOW (ref 39–117)
Bilirubin, Direct: 0.1 mg/dL (ref 0.0–0.3)
Total Bilirubin: 0.6 mg/dL (ref 0.2–1.2)
Total Protein: 7 g/dL (ref 6.0–8.3)

## 2022-08-23 LAB — BASIC METABOLIC PANEL
BUN: 13 mg/dL (ref 6–23)
CO2: 31 mEq/L (ref 19–32)
Calcium: 9.7 mg/dL (ref 8.4–10.5)
Chloride: 100 mEq/L (ref 96–112)
Creatinine, Ser: 1.23 mg/dL (ref 0.40–1.50)
GFR: 67.62 mL/min (ref >60.00)
Glucose, Bld: 110 mg/dL — ABNORMAL HIGH (ref 70–99)
Potassium: 4.4 mEq/L (ref 3.5–5.1)
Sodium: 137 mEq/L (ref 135–145)

## 2022-08-23 LAB — TSH: TSH: 4.32 u[IU]/mL (ref 0.35–5.50)

## 2022-08-23 LAB — PROTIME-INR
INR: 0.9 ratio (ref 0.8–1.0)
Prothrombin Time: 10.3 s (ref 9.6–13.1)

## 2022-08-23 MED ORDER — PANTOPRAZOLE SODIUM 40 MG PO TBEC: 40.0000 mg | DELAYED_RELEASE_TABLET | Freq: Every day | ORAL | 1 refills | Status: DC

## 2022-08-23 MED ORDER — TRIAMCINOLONE ACETONIDE 55 MCG/ACT NA AERO: 2.0000 | INHALATION_SPRAY | Freq: Every day | NASAL | 1 refills | Status: DC

## 2022-08-23 MED ORDER — LEVOCETIRIZINE DIHYDROCHLORIDE 5 MG PO TABS: 5.0000 mg | ORAL_TABLET | Freq: Every evening | ORAL | 1 refills | Status: DC

## 2022-08-23 MED ORDER — OLMESARTAN MEDOXOMIL 40 MG PO TABS: 40.0000 mg | ORAL_TABLET | Freq: Every day | ORAL | 1 refills | Status: DC

## 2022-08-23 MED ORDER — AIRSUPRA 90-80 MCG/ACT IN AERO: 2.0000 | INHALATION_SPRAY | Freq: Four times a day (QID) | RESPIRATORY_TRACT | 1 refills | Status: DC | PRN

## 2022-08-23 MED ORDER — DOXYCYCLINE HYCLATE 100 MG PO TABS: 100.0000 mg | ORAL_TABLET | Freq: Two times a day (BID) | ORAL | 0 refills | Status: AC

## 2022-08-23 NOTE — Progress Notes (Unsigned)
Subjective:  Patient ID: Bobby Rodriguez, male    DOB: 12/27/1970  Age: 52 y.o. MRN: [id number]  CC: Annual Exam, Allergic Rhinitis , Asthma, Hyperlipidemia, Hypertension, and Hypothyroidism   HPI [name] presents for a CPX and f/up ----  He complains of a 61-monthhistory of nasal pain, thick yellow-green mucus, nasal congestion, and facial pain.  He was treated elsewhere for acute sinusitis with Augmentin and has not gotten any symptom relief.  He is active and denies chest pain, shortness of breath, diaphoresis, or edema.   Outpatient Medications Prior to Visit  Medication Sig Dispense Refill   Cholecalciferol (VITAMIN D3) 50 MCG (2000 UT) capsule TAKE 1 CAPSULE BY MOUTH EVERY DAY 90 capsule 1   tadalafil (CIALIS) 20 MG tablet Take 1 tablet (20 mg total) by mouth daily as needed. 6 tablet 1   levalbuterol (XOPENEX HFA) 45 MCG/ACT inhaler Inhale 1 puff into the lungs every 6 (six) hours as needed for wheezing. 15 g 3   olmesartan (BENICAR) 40 MG tablet Take 1 tablet (40 mg total) by mouth daily. 90 tablet 0   pantoprazole (PROTONIX) 40 MG tablet TAKE 1 TABLET BY MOUTH EVERY DAY 90 tablet 0   No facility-administered medications prior to visit.    ROS Review of Systems  Constitutional:  Negative for appetite change, chills, diaphoresis, fatigue and fever.  HENT:  Positive for congestion, postnasal drip, rhinorrhea, sinus pressure and sinus pain. Negative for dental problem, facial swelling, nosebleeds and sore throat.   Eyes: Negative.   Respiratory:  Positive for wheezing. Negative for cough, chest tightness and shortness of breath.   Cardiovascular:  Negative for chest pain, palpitations and leg swelling.  Gastrointestinal:  Negative for abdominal pain, diarrhea, nausea and vomiting.  Endocrine: Negative.   Genitourinary: Negative.  Negative for difficulty urinating.  Musculoskeletal: Negative.   Skin: Negative.   Allergic/Immunologic: Negative.   Neurological:  Negative.  Negative for dizziness and weakness.  Hematological:  Negative for adenopathy. Does not bruise/bleed easily.  Psychiatric/Behavioral: Negative.      Objective:  BP 130/76 (BP Location: Right Arm, Patient Position: Sitting, Cuff Size: Large)   Pulse 91   Temp 98.1 F (36.7 C) (Oral)   Resp 16   Ht 6' (1.829 m)   Wt 252 lb (114.3 kg)   SpO2 98%   [name] 34.18 kg/m   BP Readings from Last 3 Encounters:  08/23/22 130/76  09/09/21 132/78  04/22/21 (!) 156/102    Wt Readings from Last 3 Encounters:  08/23/22 252 lb (114.3 kg)  09/09/21 250 lb (113.4 kg)  04/22/21 247 lb (112 kg)    Physical Exam Vitals reviewed. Exam conducted with a chaperone present.  Constitutional:      Appearance: He is obese. He is not ill-appearing.  HENT:     Nose: Congestion and rhinorrhea present. No nasal deformity, nasal tenderness or mucosal edema. Rhinorrhea is purulent.     Right Nostril: No epistaxis.     Left Nostril: No epistaxis.     Right Sinus: No maxillary sinus tenderness or frontal sinus tenderness.     Left Sinus: No maxillary sinus tenderness or frontal sinus tenderness.     Mouth/Throat:     Mouth: Mucous membranes are moist.  Eyes:     General: No scleral icterus.    Conjunctiva/sclera: Conjunctivae normal.  Cardiovascular:     Rate and Rhythm: Normal rate and regular rhythm.     Heart sounds: No murmur heard.  No gallop.     Comments: EKG- NSR, 72 bpm Low voltage c/w body No LVH or Q waves Pulmonary:     Effort: Pulmonary effort is normal.     Breath sounds: No stridor. No wheezing, rhonchi or rales.  Abdominal:     General: Abdomen is flat.     Palpations: There is no mass.     Tenderness: There is no abdominal tenderness. There is no guarding.     Hernia: No hernia is present. There is no hernia in the left inguinal area or right inguinal area.  Genitourinary:    Pubic Area: No rash.      Penis: Normal and circumcised.      Testes: Normal.      Epididymis:     Right: Normal.     Left: Normal.     Prostate: Normal. Not enlarged, not tender and no nodules present.     Rectum: Normal. Guaiac result negative. No mass, tenderness, anal fissure, external hemorrhoid or internal hemorrhoid. Normal anal tone.  Musculoskeletal:     Cervical back: Neck supple.     Right lower leg: No edema.     Left lower leg: No edema.  Lymphadenopathy:     Cervical: No cervical adenopathy.     Lower Body: No right inguinal adenopathy. No left inguinal adenopathy.  Skin:    General: Skin is warm and dry.     Coloration: Skin is not pale.     Findings: No erythema or rash.  Neurological:     General: No focal deficit present.     Mental Status: He is alert. Mental status is at baseline.     Lab Results  Component Value Date   WBC 5.8 08/23/2022   HGB 14.6 08/23/2022   HCT 42.1 08/23/2022   PLT 177.0 08/23/2022   GLUCOSE 110 (H) 08/23/2022   CHOL 239 (H) 08/23/2022   TRIG 79.0 08/23/2022   HDL 50.80 08/23/2022   LDLDIRECT 148.8 12/28/2012   LDLCALC 172 (H) 08/23/2022   ALT 64 (H) 08/23/2022   AST 65 (H) 08/23/2022   NA 137 08/23/2022   K 4.4 08/23/2022   CL 100 08/23/2022   CREATININE 1.23 08/23/2022   BUN 13 08/23/2022   CO2 31 08/23/2022   TSH 4.32 08/23/2022   PSA 0.93 04/22/2021   INR 0.9 08/23/2022   HGBA1C 5.1 08/23/2017    VAS [place] ABI WITH/WO TBI  Result Date: 04/30/2019 LOWER EXTREMITY DOPPLER STUDY High Risk Factors: Hypertension, past history of smoking.  Vascular Interventions: Left leg swelling, edema and pain. Comparison Study: No prior study for comparison Performing Technologist: Delorise Shiner RVT  Examination Guidelines: A complete evaluation includes at minimum, Doppler waveform signals and systolic blood pressure reading at the level of bilateral brachial, anterior tibial, and posterior tibial arteries, when vessel segments are accessible. Bilateral testing is considered an integral part of a complete examination.  Photoelectric Plethysmograph (PPG) waveforms and toe systolic pressure readings are included as required and additional duplex testing as needed. Limited examinations for reoccurring indications may be performed as noted.  ABI Findings: +---------+------------------+-----+---------+--------+ Right    Rt Pressure (mmHg)IndexWaveform Comment  +---------+------------------+-----+---------+--------+ Brachial 137                                      +---------+------------------+-----+---------+--------+ ATA      158  1.15                   +---------+------------------+-----+---------+--------+ PTA      159               1.16 triphasic         +---------+------------------+-----+---------+--------+ DP                              triphasic         +---------+------------------+-----+---------+--------+ Great Toe97                0.71                   +---------+------------------+-----+---------+--------+ +---------+------------------+-----+---------+-------+ Left     Lt Pressure (mmHg)IndexWaveform Comment +---------+------------------+-----+---------+-------+ Brachial 131                                     +---------+------------------+-----+---------+-------+ ATA      157               1.15                  +---------+------------------+-----+---------+-------+ PTA      151               1.10 triphasic        +---------+------------------+-----+---------+-------+ DP                              biphasic         +---------+------------------+-----+---------+-------+ Great Toe85                0.62                  +---------+------------------+-----+---------+-------+ +-------+-----------+-----------+------------+------------+ ABI/TBIToday's ABIToday's TBIPrevious ABIPrevious TBI +-------+-----------+-----------+------------+------------+ Right  1.16       0.71                                 +-------+-----------+-----------+------------+------------+ Left   1.15       0.62                                +-------+-----------+-----------+------------+------------+  Summary: Right: Resting right ankle-brachial index is within normal range. No evidence of significant right lower extremity arterial disease. The right toe-brachial index is normal. RT great toe pressure = 97 mmHg. Left: Resting left ankle-brachial index is within normal range. No evidence of significant left lower extremity arterial disease. The left toe-brachial index is abnormal. LT Great toe pressure = 85 mmHg.  *See table(s) above for measurements and observations.  Electronically signed by Harold Barban MD on 04/30/2019 at 4:21:36 PM.    Final     Assessment & Plan:   Jabrell was seen today for annual exam, allergic rhinitis , asthma, hyperlipidemia, hypertension and hypothyroidism.  Diagnoses and all orders for this visit:  Essential hypertension, benign- His blood pressure is well-controlled. -     CBC with Differential/Platelet; Future -     TSH; Future -     Hepatic function panel; Future -     Basic metabolic panel; Future -     EKG 12-Lead -     Basic metabolic panel -  Hepatic function panel -     TSH -     CBC with Differential/Platelet -     olmesartan (BENICAR) 40 MG tablet; Take 1 tablet (40 mg total) by mouth daily.  Dyslipidemia, goal LDL below 70-statin is not indicated -     Lipid panel; Future -     Lipid panel  Acquired hypothyroidism-he is euthyroid. -     TSH; Future -     TSH  Mild intermittent asthma without complication -     CBC with Differential/Platelet; Future -     Albuterol-Budesonide (AIRSUPRA) 90-80 MCG/ACT AERO; Inhale 2 puffs into the lungs 4 (four) times daily as needed. -     CBC with Differential/Platelet  Encounter for general adult medical examination with abnormal findings- Exam completed, labs reviewed, vaccines reviewed, cancer screenings addressed, patient  education was given. -     PSA; Future  Fatty liver disease, nonalcoholic-he is working on his lifestyle modifications. -     Hepatic function panel; Future -     Protime-INR; Future -     Protime-INR -     Hepatic function panel  Seasonal allergic rhinitis due to other allergic trigger -     levocetirizine (XYZAL) 5 MG tablet; Take 1 tablet (5 mg total) by mouth every evening. -     triamcinolone (NASACORT) 55 MCG/ACT AERO nasal inhaler; Place 2 sprays into the nose daily.  Subacute maxillary sinusitis-will evaluate for abscess, nasal polyps, obstruction of the ostiomeatal complex/and air-fluid levels.  Will cover staph with doxycycline. -     doxycycline (VIBRA-TABS) 100 MG tablet; Take 1 tablet (100 mg total) by mouth 2 (two) times daily for 10 days. -     CT Maxillofacial W/Cm; Future  Abscess, furuncle and carbuncle of nose -     CT Maxillofacial W/Cm; Future  Colon cancer screening -     Ambulatory referral to Gastroenterology  Gastroesophageal reflux disease with esophagitis, unspecified whether hemorrhage -     pantoprazole (PROTONIX) 40 MG tablet; Take 1 tablet (40 mg total) by mouth daily.   I have discontinued Aadan Zempel. [place] "DEAN"'s levalbuterol. I have also changed his pantoprazole. Additionally, I am having him start on levocetirizine, triamcinolone, doxycycline, and Airsupra. Lastly, I am having him maintain his Vitamin D3, tadalafil, and olmesartan.  Meds ordered this encounter  Medications   levocetirizine (XYZAL) 5 MG tablet    Sig: Take 1 tablet (5 mg total) by mouth every evening.    Dispense:  90 tablet    Refill:  1   triamcinolone (NASACORT) 55 MCG/ACT AERO nasal inhaler    Sig: Place 2 sprays into the nose daily.    Dispense:  97.2 mL    Refill:  1   doxycycline (VIBRA-TABS) 100 MG tablet    Sig: Take 1 tablet (100 mg total) by mouth 2 (two) times daily for 10 days.    Dispense:  20 tablet    Refill:  0   Albuterol-Budesonide (AIRSUPRA) 90-80  MCG/ACT AERO    Sig: Inhale 2 puffs into the lungs 4 (four) times daily as needed.    Dispense:  32.1 g    Refill:  1   pantoprazole (PROTONIX) 40 MG tablet    Sig: Take 1 tablet (40 mg total) by mouth daily.    Dispense:  90 tablet    Refill:  1   olmesartan (BENICAR) 40 MG tablet    Sig: Take 1 tablet (40 mg total) by mouth daily.  Dispense:  90 tablet    Refill:  1    DX Code Needed  .     Follow-up: Return in about 6 months (around 02/23/2023).  Scarlette Calico, MD

## 2022-08-23 NOTE — Patient Instructions (Signed)
Health Maintenance, Male Adopting a healthy lifestyle and getting preventive care are important in promoting health and wellness. Ask your health care provider about: The right schedule for you to have regular tests and exams. Things you can do on your own to prevent diseases and keep yourself healthy. What should I know about diet, weight, and exercise? Eat a healthy diet  Eat a diet that includes plenty of vegetables, fruits, low-fat dairy products, and lean protein. Do not eat a lot of foods that are high in solid fats, added sugars, or sodium. Maintain a healthy weight Body mass index ([name]) is a measurement that can be used to identify possible weight problems. It estimates body fat based on height and weight. Your health care provider can help determine your [name] and help you achieve or maintain a healthy weight. Get regular exercise Get regular exercise. This is one of the most important things you can do for your health. Most adults should: Exercise for at least 150 minutes each week. The exercise should increase your heart rate and make you sweat (moderate-intensity exercise). Do strengthening exercises at least twice a week. This is in addition to the moderate-intensity exercise. Spend less time sitting. Even light physical activity can be beneficial. Watch cholesterol and blood lipids Have your blood tested for lipids and cholesterol at 53 years of age, then have this test every 5 years. You may need to have your cholesterol levels checked more often if: Your lipid or cholesterol levels are high. You are older than 53 years of age. You are at high risk for heart disease. What should I know about cancer screening? Many types of cancers can be detected early and may often be prevented. Depending on your health history and family history, you may need to have cancer screening at various ages. This may include screening for: Colorectal cancer. Prostate cancer. Skin cancer. Lung  cancer. What should I know about heart disease, diabetes, and high blood pressure? Blood pressure and heart disease High blood pressure causes heart disease and increases the risk of stroke. This is more likely to develop in people who have high blood pressure readings or are overweight. Talk with your health care provider about your target blood pressure readings. Have your blood pressure checked: Every 3-5 years if you are 18-39 years of age. Every year if you are 40 years old or older. If you are between the ages of 65 and 75 and are a current or former smoker, ask your health care provider if you should have a one-time screening for abdominal aortic aneurysm (AAA). Diabetes Have regular diabetes screenings. This checks your fasting blood sugar level. Have the screening done: Once every three years after age 45 if you are at a normal weight and have a low risk for diabetes. More often and at a younger age if you are overweight or have a high risk for diabetes. What should I know about preventing infection? Hepatitis B If you have a higher risk for hepatitis B, you should be screened for this virus. Talk with your health care provider to find out if you are at risk for hepatitis B infection. Hepatitis C Blood testing is recommended for: Everyone born from 1945 through 1965. Anyone with known risk factors for hepatitis C. Sexually transmitted infections (STIs) You should be screened each year for STIs, including gonorrhea and chlamydia, if: You are sexually active and are younger than 53 years of age. You are older than 53 years of age and your   health care provider tells you that you are at risk for this type of infection. Your sexual activity has changed since you were last screened, and you are at increased risk for chlamydia or gonorrhea. Ask your health care provider if you are at risk. Ask your health care provider about whether you are at high risk for HIV. Your health care provider  may recommend a prescription medicine to help prevent HIV infection. If you choose to take medicine to prevent HIV, you should first get tested for HIV. You should then be tested every 3 months for as long as you are taking the medicine. Follow these instructions at home: Alcohol use Do not drink alcohol if your health care provider tells you not to drink. If you drink alcohol: Limit how much you have to 0-2 drinks a day. Know how much alcohol is in your drink. In the [place] one drink equals one 12 oz bottle of beer (355 mL), one 5 oz glass of wine (148 mL), or one 1 oz glass of hard liquor (44 mL). Lifestyle Do not use any products that contain nicotine or tobacco. These products include cigarettes, chewing tobacco, and vaping devices, such as e-cigarettes. If you need help quitting, ask your health care provider. Do not use street drugs. Do not share needles. Ask your health care provider for help if you need support or information about quitting drugs. General instructions Schedule regular health, dental, and eye exams. Stay current with your vaccines. Tell your health care provider if: You often feel depressed. You have ever been abused or do not feel safe at home. Summary Adopting a healthy lifestyle and getting preventive care are important in promoting health and wellness. Follow your health care provider's instructions about healthy diet, exercising, and getting tested or screened for diseases. Follow your health care provider's instructions on monitoring your cholesterol and blood pressure. This information is not intended to replace advice given to you by your health care provider. Make sure you discuss any questions you have with your health care provider. Document Revised: 10/20/2020 Document Reviewed: 10/20/2020 Elsevier Patient Education  2023 [employer]  

## 2022-08-26 ENCOUNTER — Encounter: Payer: Self-pay | Admitting: Internal Medicine

## 2022-09-20 ENCOUNTER — Ambulatory Visit (AMBULATORY_SURGERY_CENTER): Payer: BC Managed Care – PPO | Admitting: *Deleted

## 2022-09-20 ENCOUNTER — Ambulatory Visit
Admission: RE | Admit: 2022-09-20 | Discharge: 2022-09-20 | Disposition: A | Discharge status: Home / Self Care | Payer: BC Managed Care – PPO | Source: Ambulatory Visit | Attending: Internal Medicine | Admitting: Internal Medicine

## 2022-09-20 VITALS — Ht 72.0 in | Wt 250.0 lb

## 2022-09-20 DIAGNOSIS — Z1211 Encounter for screening for malignant neoplasm of colon: Secondary | ICD-10-CM

## 2022-09-20 DIAGNOSIS — J0100 Acute maxillary sinusitis, unspecified: Secondary | ICD-10-CM

## 2022-09-20 DIAGNOSIS — J329 Chronic sinusitis, unspecified: Secondary | ICD-10-CM | POA: Diagnosis not present

## 2022-09-20 DIAGNOSIS — J340 Abscess, furuncle and carbuncle of nose: Secondary | ICD-10-CM

## 2022-09-20 MED ORDER — IOPAMIDOL (ISOVUE-300) INJECTION 61%
75.0000 mL | Freq: Once | INTRAVENOUS | Status: AC | PRN
  Administered 2022-09-20: 75 mL via INTRAVENOUS

## 2022-09-20 MED ORDER — NA SULFATE-K SULFATE-MG SULF 17.5-3.13-1.6 GM/177ML PO SOLN: 1.0000 | Freq: Once | ORAL | 0 refills | Status: AC

## 2022-09-20 NOTE — Progress Notes (Signed)

## 2022-09-29 ENCOUNTER — Encounter: Payer: Self-pay | Admitting: Internal Medicine

## 2022-09-30 ENCOUNTER — Other Ambulatory Visit: Payer: Self-pay | Admitting: Internal Medicine

## 2022-09-30 DIAGNOSIS — J339 Nasal polyp, unspecified: Secondary | ICD-10-CM | POA: Insufficient documentation

## 2022-10-05 ENCOUNTER — Other Ambulatory Visit: Payer: Self-pay | Admitting: Internal Medicine

## 2022-10-05 DIAGNOSIS — N522 Drug-induced erectile dysfunction: Secondary | ICD-10-CM

## 2022-10-05 DIAGNOSIS — E559 Vitamin D deficiency, unspecified: Secondary | ICD-10-CM

## 2022-10-07 ENCOUNTER — Encounter: Payer: Self-pay | Admitting: Gastroenterology

## 2022-10-18 ENCOUNTER — Ambulatory Visit (AMBULATORY_SURGERY_CENTER): Payer: BC Managed Care – PPO | Admitting: Gastroenterology

## 2022-10-18 ENCOUNTER — Encounter: Payer: Self-pay | Admitting: Gastroenterology

## 2022-10-18 VITALS — BP 99/45 | HR 79 | Temp 97.5°F | Resp 21 | Ht 72.0 in | Wt 250.0 lb

## 2022-10-18 DIAGNOSIS — Z1211 Encounter for screening for malignant neoplasm of colon: Secondary | ICD-10-CM | POA: Diagnosis not present

## 2022-10-18 MED ORDER — SODIUM CHLORIDE 0.9 % IV SOLN
500.0000 mL | Freq: Once | INTRAVENOUS | Status: DC
Start: 2022-10-18 — End: 2022-10-18

## 2022-10-18 NOTE — Progress Notes (Signed)
History & Physical  Primary Care Physician:  Etta Grandchild, MD Primary Gastroenterologist: Claudette Head, MD  Impression / Plan:  Average risk CRC screening for colonoscopy.  CHIEF COMPLAINT:  CRC screening  HPI: Bobby Rodriguez is a 52 y.o. male average risk CRC screening for colonoscopy.   Past Medical History:  Diagnosis Date   Allergy    seasonal   Esophageal stricture    GERD (gastroesophageal reflux disease)    Hypertension     Past Surgical History:  Procedure Laterality Date   LEFT HEART CATH AND CORONARY ANGIOGRAPHY N/A 03/01/2017   Procedure: LEFT HEART CATH AND CORONARY ANGIOGRAPHY;  Surgeon: Marykay Lex, MD;  Location: [facility] INVASIVE CV LAB;  Service: Cardiovascular: Mild/minimal LAD disease but otherwise normal coronary arteries. Normal EF.Marland Kitchen     UPPER GASTROINTESTINAL ENDOSCOPY     WISDOM TOOTH EXTRACTION      Prior to Admission medications   Medication Sig Start Date End Date Taking? Authorizing Provider  Cholecalciferol (VITAMIN D3) 50 MCG (2000 UT) capsule TAKE 1 CAPSULE BY MOUTH EVERY DAY 10/05/22  Yes Etta Grandchild, MD  levocetirizine (XYZAL) 5 MG tablet Take 1 tablet (5 mg total) by mouth every evening. 08/23/22  Yes Etta Grandchild, MD  Multiple Vitamin (MULTIVITAMIN ADULT PO) Take by mouth daily.   Yes [provider]  olmesartan (BENICAR) 40 MG tablet Take 1 tablet (40 mg total) by mouth daily. 08/23/22  Yes Etta Grandchild, MD  pantoprazole (PROTONIX) 40 MG tablet Take 1 tablet (40 mg total) by mouth daily. 08/23/22  Yes Etta Grandchild, MD  Albuterol-Budesonide (AIRSUPRA) 90-80 MCG/ACT AERO Inhale 2 puffs into the lungs 4 (four) times daily as needed. 08/23/22   Etta Grandchild, MD  tadalafil (CIALIS) 20 MG tablet TAKE 1 TABLET BY MOUTH EVERY DAY AS NEEDED 10/05/22   Etta Grandchild, MD  triamcinolone (NASACORT) 55 MCG/ACT AERO nasal inhaler Place 2 sprays into the nose daily. Patient not taking: Reported on 09/20/2022 08/23/22   Etta Grandchild, MD    Current Outpatient Medications  Medication Sig Dispense Refill   Cholecalciferol (VITAMIN D3) 50 MCG (2000 UT) capsule TAKE 1 CAPSULE BY MOUTH EVERY DAY 90 capsule 1   levocetirizine (XYZAL) 5 MG tablet Take 1 tablet (5 mg total) by mouth every evening. 90 tablet 1   Multiple Vitamin (MULTIVITAMIN ADULT PO) Take by mouth daily.     olmesartan (BENICAR) 40 MG tablet Take 1 tablet (40 mg total) by mouth daily. 90 tablet 1   pantoprazole (PROTONIX) 40 MG tablet Take 1 tablet (40 mg total) by mouth daily. 90 tablet 1   Albuterol-Budesonide (AIRSUPRA) 90-80 MCG/ACT AERO Inhale 2 puffs into the lungs 4 (four) times daily as needed. 32.1 g 1   tadalafil (CIALIS) 20 MG tablet TAKE 1 TABLET BY MOUTH EVERY DAY AS NEEDED 6 tablet 1   triamcinolone (NASACORT) 55 MCG/ACT AERO nasal inhaler Place 2 sprays into the nose daily. (Patient not taking: Reported on 09/20/2022) 97.2 mL 1   Current Facility-Administered Medications  Medication Dose Route Frequency Provider Last Rate Last Admin   0.9 %  sodium chloride infusion  500 mL Intravenous Once Meryl Dare, MD        Allergies as of 10/18/2022 - Review Complete 10/18/2022  Allergen Reaction Noted   Shrimp [shellfish allergy]  03/01/2017    Family History  Problem Relation Age of Onset   Colon polyps Father    Hypertension Father  Ulcers Father    Dementia Father    Ulcers Sister    Stroke Neg Hx    Kidney disease Neg Hx    Cancer Neg Hx    Diabetes Neg Hx    Early death Neg Hx    Hearing loss Neg Hx    Heart disease Neg Hx    Hyperlipidemia Neg Hx    Colon cancer Neg Hx    Crohn's disease Neg Hx    Stomach cancer Neg Hx    Ulcerative colitis Neg Hx    Uterine cancer Neg Hx     Social History   Socioeconomic History   Marital status: Married    Spouse name: Not on file   Number of children: 1   Years of education: Not on file   Highest education level: Not on file  Occupational History   Occupation: Occupational psychologist: [employer]  Tobacco Use   Smoking status: Never   Smokeless tobacco: Former    Quit date: 06/15/2007  Vaping Use   Vaping Use: Never used  Substance and Sexual Activity   Alcohol use: Yes    Comment: 1-2 drinks per day   Drug use: No   Sexual activity: Yes  Other Topics Concern   Not on file  Social History Narrative   Not on file   Social Determinants of Health   Financial Resource Strain: Not on file  Food Insecurity: Not on file  Transportation Needs: Not on file  Physical Activity: Not on file  Stress: Not on file  Social Connections: Not on file  Intimate Partner Violence: Not on file    Review of Systems:  All systems reviewed were negative except where noted in HPI.   Physical Exam: General:  Alert, well-developed, in NAD Head:  Normocephalic and atraumatic. Eyes:  Sclera clear, no icterus.   Conjunctiva pink. Ears:  Normal auditory acuity. Mouth:  No deformity or lesions.  Neck:  Supple; no masses. Lungs:  Clear throughout to auscultation.   No wheezes, crackles, or rhonchi.  Heart:  Regular rate and rhythm; no murmurs. Abdomen:  Soft, nondistended, nontender. No masses, hepatomegaly. No palpable masses.  Normal bowel sounds.    Rectal:  Deferred   Msk:  Symmetrical without gross deformities. Extremities:  Without edema. Neurologic:  Alert and  oriented x 4; grossly normal neurologically. Skin:  Intact without significant lesions or rashes. Psych:  Alert and cooperative. Normal mood and affect.   Venita Lick. Russella Dar  10/18/2022, 9:18 AM See Loretha Stapler, [facility], to contact our on call provider

## 2022-10-18 NOTE — Patient Instructions (Signed)
Handouts on Diverticulosis and high fiber diet provided.  Continue present medications.  YOU HAD AN ENDOSCOPIC PROCEDURE TODAY AT THE [facility]:   Refer to the procedure report that was given to you for any specific questions about what was found during the examination.  If the procedure report does not answer your questions, please call your gastroenterologist to clarify.  If you requested that your care partner not be given the details of your procedure findings, then the procedure report has been included in a sealed envelope for you to review at your convenience later.  YOU SHOULD EXPECT: Some feelings of bloating in the abdomen. Passage of more gas than usual.  Walking can help get rid of the air that was put into your GI tract during the procedure and reduce the bloating. If you had a lower endoscopy (such as a colonoscopy or flexible sigmoidoscopy) you may notice spotting of blood in your stool or on the toilet paper. If you underwent a bowel prep for your procedure, you may not have a normal bowel movement for a few days.  Please Note:  You might notice some irritation and congestion in your nose or some drainage.  This is from the oxygen used during your procedure.  There is no need for concern and it should clear up in a day or so.  SYMPTOMS TO REPORT IMMEDIATELY:  Following lower endoscopy (colonoscopy or flexible sigmoidoscopy):  Excessive amounts of blood in the stool  Significant tenderness or worsening of abdominal pains  Swelling of the abdomen that is new, acute  Fever of 100F or higher   For urgent or emergent issues, a gastroenterologist can be reached at any hour by calling [phone]. Do not use MyChart messaging for urgent concerns.    DIET:  We do recommend a small meal at first, but then you may proceed to your regular diet.  Drink plenty of fluids but you should avoid alcoholic beverages for 24 hours.  ACTIVITY:  You should plan to take it easy  for the rest of today and you should NOT DRIVE or use heavy machinery until tomorrow (because of the sedation medicines used during the test).    FOLLOW UP: Our staff will call the number listed on your records the next business day following your procedure.  We will call around 7:15- 8:00 am to check on you and address any questions or concerns that you may have regarding the information given to you following your procedure. If we do not reach you, we will leave a message.     If any biopsies were taken you will be contacted by phone or by letter within the next 1-3 weeks.  Please call us at [phone] if you have not heard about the biopsies in 3 weeks.    SIGNATURES/CONFIDENTIALITY: You and/or your care partner have signed paperwork which will be entered into your electronic medical record.  These signatures attest to the fact that that the information above on your After Visit Summary has been reviewed and is understood.  Full responsibility of the confidentiality of this discharge information lies with you and/or your care-partner.

## 2022-10-18 NOTE — Progress Notes (Signed)
Pt's states no medical or surgical changes since previsit or office visit. 

## 2022-10-18 NOTE — Op Note (Signed)
[facility] Patient Name: Bobby Rodriguez Procedure Date: 10/18/2022 9:19 AM MRN: [id number] Endoscopist: Meryl Dare , MD, [phone] Age: 52 Referring MD:  Date of Birth: 11-19-1970 Gender: Male Account #: [id number] Procedure:                Colonoscopy Indications:              Screening for colorectal malignant neoplasm Medicines:                Monitored Anesthesia Care Procedure:                Pre-Anesthesia Assessment:                           - Prior to the procedure, a History and Physical                            was performed, and patient medications and                            allergies were reviewed. The patient's tolerance of                            previous anesthesia was also reviewed. The risks                            and benefits of the procedure and the sedation                            options and risks were discussed with the patient.                            All questions were answered, and informed consent                            was obtained. Prior Anticoagulants: The patient has                            taken no anticoagulant or antiplatelet agents. ASA                            Grade Assessment: II - A patient with mild systemic                            disease. After reviewing the risks and benefits,                            the patient was deemed in satisfactory condition to                            undergo the procedure.                           After obtaining informed consent, the colonoscope  was passed under direct vision. Throughout the                            procedure, the patient's blood pressure, pulse, and                            oxygen saturations were monitored continuously. The                            CF [id number] #[id number] was introduced through the anus                            and advanced to the the cecum, identified by                            appendiceal orifice  and ileocecal valve. The                            ileocecal valve, appendiceal orifice, and rectum                            were photographed. The quality of the bowel                            preparation was excellent. The colonoscopy was                            performed without difficulty. The patient tolerated                            the procedure well. Scope In: 9:24:14 AM Scope Out: 9:37:10 AM Scope Withdrawal Time: 0 hours 10 minutes 34 seconds  Total Procedure Duration: 0 hours 12 minutes 56 seconds  Findings:                 The perianal and digital rectal examinations were                            normal.                           A few small-mouthed diverticula were found in the                            left colon. There was no evidence of diverticular                            bleeding.                           The exam was otherwise without abnormality on                            direct and retroflexion views. Complications:            No immediate complications. Estimated blood loss:  None. Estimated Blood Loss:     Estimated blood loss: none. Impression:               - Mild diverticulosis in the left colon.                           - The examination was otherwise normal on direct                            and retroflexion views.                           - No specimens collected. Recommendation:           - Repeat colonoscopy in 10 years for screening                            purposes.                           - Patient has a contact number available for                            emergencies. The signs and symptoms of potential                            delayed complications were discussed with the                            patient. Return to normal activities tomorrow.                            Written discharge instructions were provided to the                            patient.                           - High  fiber diet.                           - Continue present medications. Meryl Dare, MD 10/18/2022 9:40:18 AM This report has been signed electronically.

## 2022-10-18 NOTE — Progress Notes (Signed)
Report to PACU, RN, vss, BBS= Clear.  

## 2022-10-19 ENCOUNTER — Telehealth: Payer: Self-pay

## 2022-10-19 NOTE — Telephone Encounter (Signed)
  Follow up Call-     10/18/2022    8:42 AM  Call back number  Post procedure Call Back phone  # [phone]  Permission to leave phone message Yes     Patient questions:  Do you have a fever, pain , or abdominal swelling? No. Pain Score  0 *  Have you tolerated food without any problems? Yes.    Have you been able to return to your normal activities? Yes.    Do you have any questions about your discharge instructions: Diet   No. Medications  No. Follow up visit  No.  Do you have questions or concerns about your Care? No.  Actions: * If pain score is 4 or above: No action needed, pain <4.

## 2022-11-22 ENCOUNTER — Other Ambulatory Visit: Payer: Self-pay | Admitting: Internal Medicine

## 2022-11-22 DIAGNOSIS — N522 Drug-induced erectile dysfunction: Secondary | ICD-10-CM

## 2022-11-23 DIAGNOSIS — J329 Chronic sinusitis, unspecified: Secondary | ICD-10-CM | POA: Diagnosis not present

## 2022-11-23 DIAGNOSIS — J309 Allergic rhinitis, unspecified: Secondary | ICD-10-CM | POA: Diagnosis not present

## 2022-11-23 DIAGNOSIS — J339 Nasal polyp, unspecified: Secondary | ICD-10-CM | POA: Diagnosis not present

## 2023-01-22 ENCOUNTER — Other Ambulatory Visit: Payer: Self-pay | Admitting: Internal Medicine

## 2023-01-22 DIAGNOSIS — I10 Essential (primary) hypertension: Secondary | ICD-10-CM

## 2023-01-22 DIAGNOSIS — J3089 Other allergic rhinitis: Secondary | ICD-10-CM

## 2023-02-28 ENCOUNTER — Other Ambulatory Visit: Payer: Self-pay | Admitting: Internal Medicine

## 2023-02-28 DIAGNOSIS — N522 Drug-induced erectile dysfunction: Secondary | ICD-10-CM

## 2023-03-08 ENCOUNTER — Other Ambulatory Visit: Payer: Self-pay | Admitting: Internal Medicine

## 2023-03-08 DIAGNOSIS — N522 Drug-induced erectile dysfunction: Secondary | ICD-10-CM

## 2023-03-09 ENCOUNTER — Encounter: Payer: Self-pay | Admitting: Internal Medicine

## 2023-03-14 ENCOUNTER — Ambulatory Visit: Payer: BC Managed Care – PPO | Admitting: Internal Medicine

## 2023-03-14 VITALS — BP 128/78 | HR 77 | Temp 98.1°F | Resp 16 | Ht 72.0 in | Wt 246.0 lb

## 2023-03-14 DIAGNOSIS — K760 Fatty (change of) liver, not elsewhere classified: Secondary | ICD-10-CM | POA: Diagnosis not present

## 2023-03-14 DIAGNOSIS — Z125 Encounter for screening for malignant neoplasm of prostate: Secondary | ICD-10-CM | POA: Insufficient documentation

## 2023-03-14 DIAGNOSIS — Z23 Encounter for immunization: Secondary | ICD-10-CM | POA: Diagnosis not present

## 2023-03-14 DIAGNOSIS — I10 Essential (primary) hypertension: Secondary | ICD-10-CM | POA: Diagnosis not present

## 2023-03-14 DIAGNOSIS — E039 Hypothyroidism, unspecified: Secondary | ICD-10-CM | POA: Diagnosis not present

## 2023-03-14 DIAGNOSIS — K2100 Gastro-esophageal reflux disease with esophagitis, without bleeding: Secondary | ICD-10-CM

## 2023-03-14 DIAGNOSIS — J4520 Mild intermittent asthma, uncomplicated: Secondary | ICD-10-CM

## 2023-03-14 DIAGNOSIS — E785 Hyperlipidemia, unspecified: Secondary | ICD-10-CM | POA: Diagnosis not present

## 2023-03-14 LAB — CBC WITH DIFFERENTIAL/PLATELET
Basophils Absolute: 0 10*3/uL (ref 0.0–0.1)
Basophils Relative: 0.5 % (ref 0.0–3.0)
Eosinophils Absolute: 0.1 10*3/uL (ref 0.0–0.7)
Eosinophils Relative: 2.2 % (ref 0.0–5.0)
HCT: 41.6 % (ref 39.0–52.0)
Hemoglobin: 14.4 g/dL (ref 13.0–17.0)
Lymphocytes Relative: 40.3 % (ref 12.0–46.0)
Lymphs Abs: 2.7 10*3/uL (ref 0.7–4.0)
MCHC: 34.6 g/dL (ref 30.0–36.0)
MCV: 92.4 fL (ref 78.0–100.0)
Monocytes Absolute: 0.7 10*3/uL (ref 0.1–1.0)
Monocytes Relative: 11.4 % (ref 3.0–12.0)
Neutro Abs: 3 10*3/uL (ref 1.4–7.7)
Neutrophils Relative %: 45.6 % (ref 43.0–77.0)
Platelets: 194 10*3/uL (ref 150.0–400.0)
RBC: 4.5 Mil/uL (ref 4.22–5.81)
RDW: 12.5 % (ref 11.5–15.5)
WBC: 6.6 10*3/uL (ref 4.0–10.5)

## 2023-03-14 LAB — TSH: TSH: 3.34 u[IU]/mL (ref 0.35–5.50)

## 2023-03-14 LAB — PSA: PSA: 1.06 ng/mL (ref 0.10–4.00)

## 2023-03-14 NOTE — Patient Instructions (Signed)

## 2023-03-14 NOTE — Progress Notes (Unsigned)
Subjective:  Patient ID: Bobby Rodriguez, male    DOB: 1970/07/08  Age: 52 y.o. MRN: [id number]  CC: Hypertension, Hyperlipidemia, and Asthma   HPI [place] presents for f/up ----  Discussed the use of AI scribe software for clinical note transcription with the patient, who gave verbal consent to proceed.  History of Present Illness   The patient, with a history of hypertension and asthma, reports no recent symptoms of chest pain, shortness of breath, dizziness, or lightheadedness. They deny any recent asthma symptoms, coughing, wheezing, or shortness of breath. They have not experienced any issues with their blood pressure being too high or too low. They also report no recent trouble with their breathing.  The patient's recent colonoscopy in May was uneventful with no complications. They have been maintaining an active lifestyle, [place]  The patient's mother, who is 4 years old and lives alone, has been diagnosed with dementia. This has led to some challenging situations for the patient, as they are the primary caregiver following the passing of their father and sister.       Outpatient Medications Prior to Visit  Medication Sig Dispense Refill   Albuterol-Budesonide (AIRSUPRA) 90-80 MCG/ACT AERO Inhale 2 puffs into the lungs 4 (four) times daily as needed. 32.1 g 1   Cholecalciferol (VITAMIN D3) 50 MCG (2000 UT) capsule TAKE 1 CAPSULE BY MOUTH EVERY DAY 90 capsule 1   levocetirizine (XYZAL) 5 MG tablet TAKE 1 TABLET BY MOUTH EVERY DAY IN THE EVENING 90 tablet 0   Multiple Vitamin (MULTIVITAMIN ADULT PO) Take by mouth daily.     olmesartan (BENICAR) 40 MG tablet TAKE 1 TABLET BY MOUTH EVERY DAY 90 tablet 0   pantoprazole (PROTONIX) 40 MG tablet Take 1 tablet (40 mg total) by mouth daily. 90 tablet 1   tadalafil (CIALIS) 20 MG tablet TAKE 1 TABLET BY MOUTH EVERY DAY AS NEEDED 6 tablet 1   triamcinolone (NASACORT) 55 MCG/ACT AERO  nasal inhaler Place 2 sprays into the nose daily. 97.2 mL 1   No facility-administered medications prior to visit.    ROS Review of Systems  Constitutional:  Negative for appetite change, diaphoresis, fatigue and unexpected weight change.  HENT: Negative.    Respiratory:  Negative for cough, chest tightness, shortness of breath and wheezing.   Cardiovascular:  Negative for chest pain, palpitations and leg swelling.  Gastrointestinal:  Negative for abdominal pain, constipation, diarrhea, nausea and vomiting.  Endocrine: Negative.   Genitourinary: Negative.  Negative for difficulty urinating.  Musculoskeletal: Negative.   Skin: Negative.   Neurological: Negative.  Negative for dizziness and weakness.  Hematological: Negative.   Psychiatric/Behavioral: Negative.      Objective:  BP 128/78 (BP Location: Left Arm, Patient Position: Sitting, Cuff Size: Large)   Pulse 77   Temp 98.1 F (36.7 C) (Oral)   Resp 16   Ht 6' (1.829 m)   Wt 246 lb (111.6 kg)   SpO2 98%   [name] 33.36 kg/m   BP Readings from Last 3 Encounters:  03/14/23 128/78  10/18/22 (!) 99/45  08/23/22 130/76    Wt Readings from Last 3 Encounters:  03/14/23 246 lb (111.6 kg)  10/18/22 250 lb (113.4 kg)  09/20/22 250 lb (113.4 kg)    Physical Exam Vitals reviewed.  Constitutional:      Appearance: Normal appearance. He is not ill-appearing.  HENT:     Mouth/Throat:     Mouth:  Mucous membranes are moist.  Eyes:     General: No scleral icterus.    Conjunctiva/sclera: Conjunctivae normal.  Cardiovascular:     Rate and Rhythm: Normal rate and regular rhythm.     Heart sounds: No murmur heard.    No friction rub. No gallop.  Pulmonary:     Effort: Pulmonary effort is normal.     Breath sounds: No stridor. No wheezing, rhonchi or rales.  Abdominal:     General: Abdomen is protuberant. Bowel sounds are normal.     Palpations: There is no hepatomegaly, splenomegaly or mass.     Tenderness: There is no  abdominal tenderness. There is no guarding.  Musculoskeletal:        General: Normal range of motion.     Cervical back: Neck supple.     Right lower leg: No edema.     Left lower leg: No edema.  Lymphadenopathy:     Cervical: No cervical adenopathy.  Skin:    General: Skin is warm and dry.  Neurological:     General: No focal deficit present.     Mental Status: He is alert. Mental status is at baseline.  Psychiatric:        Mood and Affect: Mood normal.        Behavior: Behavior normal.     Lab Results  Component Value Date   WBC 6.6 03/14/2023   HGB 14.4 03/14/2023   HCT 41.6 03/14/2023   PLT 194.0 03/14/2023   GLUCOSE 96 03/14/2023   CHOL 219 (H) 03/14/2023   TRIG 82.0 03/14/2023   HDL 48.00 03/14/2023   LDLDIRECT 148.8 12/28/2012   LDLCALC 154 (H) 03/14/2023   ALT 57 (H) 03/14/2023   AST 44 (H) 03/14/2023   NA 138 03/14/2023   K 4.5 03/14/2023   CL 100 03/14/2023   CREATININE 1.17 03/14/2023   BUN 20 03/14/2023   CO2 30 03/14/2023   TSH 3.34 03/14/2023   PSA 1.06 03/14/2023   INR 0.9 08/23/2022   HGBA1C 5.1 08/23/2017    CT Maxillofacial W/Cm  Result Date: 09/22/2022 CLINICAL DATA:  Nasal abscess.  Recurrent sinusitis. EXAM: CT MAXILLOFACIAL WITH CONTRAST TECHNIQUE: Multidetector CT imaging of the maxillofacial structures was performed with intravenous contrast. Multiplanar CT image reconstructions were also generated. RADIATION DOSE REDUCTION: This exam was performed according to the departmental dose-optimization program which includes automated exposure control, adjustment of the mA and/or kV according to patient size and/or use of iterative reconstruction technique. CONTRAST:  75mL ISOVUE-300 IOPAMIDOL (ISOVUE-300) INJECTION 61% COMPARISON:  None Available. FINDINGS: Osseous: Remote nasal bone fracture.  No destructive osseous lesion. Orbits: Unremarkable. Sinuses: Mild mucosal thickening inferiorly in the left frontal sinus with opacification of the left  frontal recess. Mild left anterior ethmoid air cell mucosal thickening. The other paranasal sinuses are clear, and there are no fluid levels. Patent ostiomeatal units. Intact, midline nasal septum. Small amount of nodular soft tissue in the nasal cavity bilaterally at the level of the middle turbinates. Clear mastoid air cells and middle ear cavities. Soft tissues: Unremarkable. Limited intracranial: Unremarkable. IMPRESSION: 1. Mild left frontal and ethmoid sinus mucosal thickening. 2. Possible small bilateral nasal polyps. Electronically Signed   By: Sebastian Ache M.D.   On: 09/22/2022 15:17    Assessment & Plan:  Prostate cancer screening -     PSA; Future  Essential hypertension, benign- His BP is well-controlled. -     TSH; Future -     Urinalysis, Routine w reflex  microscopic; Future -     Basic metabolic panel; Future -     CBC with Differential/Platelet; Future  Acquired hypothyroidism- He is euthyroid. -     TSH; Future  Fatty liver disease, nonalcoholic- He is working on his lifestyle modifications. -     Hepatic function panel; Future  Dyslipidemia, goal LDL below 70- Statin is not indicated. -     Lipid panel; Future -     TSH; Future -     Hepatic function panel; Future  Other orders -     Tdap vaccine greater than or equal to 7yo IM     Follow-up: Return in about 6 months (around 09/11/2023).  Sanda Linger, MD

## 2023-03-15 LAB — BASIC METABOLIC PANEL
BUN: 20 mg/dL (ref 6–23)
CO2: 30 meq/L (ref 19–32)
Calcium: 9.5 mg/dL (ref 8.4–10.5)
Chloride: 100 meq/L (ref 96–112)
Creatinine, Ser: 1.17 mg/dL (ref 0.40–1.50)
GFR: 71.52 mL/min (ref >60.00)
Glucose, Bld: 96 mg/dL (ref 70–99)
Potassium: 4.5 meq/L (ref 3.5–5.1)
Sodium: 138 meq/L (ref 135–145)

## 2023-03-15 LAB — URINALYSIS, ROUTINE W REFLEX MICROSCOPIC
Bilirubin Urine: NEGATIVE
Hgb urine dipstick: NEGATIVE
Ketones, ur: NEGATIVE
Leukocytes,Ua: NEGATIVE
Nitrite: NEGATIVE
RBC / HPF: NONE SEEN (ref 0–?)
Specific Gravity, Urine: 1.01 (ref 1.000–1.030)
Total Protein, Urine: NEGATIVE
Urine Glucose: NEGATIVE
Urobilinogen, UA: 0.2 (ref 0.0–1.0)
WBC, UA: NONE SEEN (ref 0–?)
pH: 6 (ref 5.0–8.0)

## 2023-03-15 LAB — LIPID PANEL
Cholesterol: 219 mg/dL — ABNORMAL HIGH (ref 0–200)
HDL: 48 mg/dL (ref >39.00)
LDL Cholesterol: 154 mg/dL — ABNORMAL HIGH (ref 0–99)
NonHDL: 170.74
Total CHOL/HDL Ratio: 5
Triglycerides: 82 mg/dL (ref 0.0–149.0)
VLDL: 16.4 mg/dL (ref 0.0–40.0)

## 2023-03-15 LAB — HEPATIC FUNCTION PANEL
ALT: 57 U/L — ABNORMAL HIGH (ref 0–53)
AST: 44 U/L — ABNORMAL HIGH (ref 0–37)
Albumin: 4.7 g/dL (ref 3.5–5.2)
Alkaline Phosphatase: 34 U/L — ABNORMAL LOW (ref 39–117)
Bilirubin, Direct: 0.1 mg/dL (ref 0.0–0.3)
Total Bilirubin: 1 mg/dL (ref 0.2–1.2)
Total Protein: 7.2 g/dL (ref 6.0–8.3)

## 2023-03-15 MED ORDER — AIRSUPRA 90-80 MCG/ACT IN AERO
2.0000 | INHALATION_SPRAY | Freq: Four times a day (QID) | RESPIRATORY_TRACT | 1 refills | Status: DC | PRN
Start: 2023-03-15 — End: 2023-12-19

## 2023-03-15 MED ORDER — PANTOPRAZOLE SODIUM 40 MG PO TBEC
40.0000 mg | DELAYED_RELEASE_TABLET | Freq: Every day | ORAL | 1 refills | Status: DC
Start: 2023-03-15 — End: 2023-12-19

## 2023-03-26 ENCOUNTER — Other Ambulatory Visit: Payer: Self-pay | Admitting: Internal Medicine

## 2023-03-26 DIAGNOSIS — N522 Drug-induced erectile dysfunction: Secondary | ICD-10-CM

## 2023-06-26 ENCOUNTER — Other Ambulatory Visit: Payer: Self-pay | Admitting: Internal Medicine

## 2023-06-26 DIAGNOSIS — I10 Essential (primary) hypertension: Secondary | ICD-10-CM

## 2023-09-23 ENCOUNTER — Other Ambulatory Visit: Payer: Self-pay | Admitting: Internal Medicine

## 2023-09-23 DIAGNOSIS — I10 Essential (primary) hypertension: Secondary | ICD-10-CM

## 2023-10-14 ENCOUNTER — Other Ambulatory Visit: Payer: Self-pay | Admitting: Internal Medicine

## 2023-10-14 DIAGNOSIS — N522 Drug-induced erectile dysfunction: Secondary | ICD-10-CM

## 2023-10-20 ENCOUNTER — Encounter: Payer: Self-pay | Admitting: Internal Medicine

## 2023-10-21 ENCOUNTER — Other Ambulatory Visit: Payer: Self-pay

## 2023-10-21 DIAGNOSIS — N522 Drug-induced erectile dysfunction: Secondary | ICD-10-CM

## 2023-10-21 MED ORDER — TADALAFIL 20 MG PO TABS: 20.0000 mg | ORAL_TABLET | Freq: Every day | ORAL | 5 refills | Status: DC | PRN

## 2023-11-17 ENCOUNTER — Other Ambulatory Visit: Payer: Self-pay | Admitting: Internal Medicine

## 2023-11-17 DIAGNOSIS — I10 Essential (primary) hypertension: Secondary | ICD-10-CM

## 2023-11-23 ENCOUNTER — Encounter: Payer: Self-pay | Admitting: Internal Medicine

## 2023-11-28 NOTE — Telephone Encounter (Unsigned)
 Copied from CRM [id number]. Topic: Clinical - Medication Refill >> Nov 28, 2023 11:29 AM Cruzita Dopp I wrote: Medication: olmesartan  (BENICAR ) 40 MG tablet   Has the patient contacted their pharmacy? Yes (Agent: If no, request that the patient contact the pharmacy for the refill. If patient does not wish to contact the pharmacy document the reason why and proceed with request.) (Agent: If yes, when and what did the pharmacy advise?)  This is the patient's preferred pharmacy:  [facility] [phone] [address]onal Frohlich, [place] - [phone] [place] [address] Phone: [phone] Fax: [phone]  Is this the correct pharmacy for this prescription? Yes If no, delete pharmacy and type the correct one.   Has the prescription been filled recently? No  Is the patient out of the medication? Yes  Has the patient been seen for an appointment in the last year OR does the patient have an upcoming appointment? Yes  Can we respond through MyChart? Yes  Agent: Please be advised that Rx refills may take up to 3 business days. We ask that you follow-up with your pharmacy.

## 2023-12-06 ENCOUNTER — Other Ambulatory Visit: Payer: Self-pay

## 2023-12-06 DIAGNOSIS — Z79899 Other long term (current) drug therapy: Secondary | ICD-10-CM | POA: Diagnosis not present

## 2023-12-06 DIAGNOSIS — K7460 Unspecified cirrhosis of liver: Secondary | ICD-10-CM | POA: Diagnosis not present

## 2023-12-06 DIAGNOSIS — R7989 Other specified abnormal findings of blood chemistry: Secondary | ICD-10-CM | POA: Diagnosis not present

## 2023-12-06 DIAGNOSIS — R0789 Other chest pain: Secondary | ICD-10-CM | POA: Diagnosis not present

## 2023-12-06 DIAGNOSIS — Z91148 Patient's other noncompliance with medication regimen for other reason: Secondary | ICD-10-CM | POA: Diagnosis not present

## 2023-12-06 DIAGNOSIS — I160 Hypertensive urgency: Secondary | ICD-10-CM | POA: Insufficient documentation

## 2023-12-06 DIAGNOSIS — L03115 Cellulitis of right lower limb: Secondary | ICD-10-CM | POA: Diagnosis not present

## 2023-12-06 DIAGNOSIS — R0602 Shortness of breath: Secondary | ICD-10-CM | POA: Diagnosis not present

## 2023-12-06 DIAGNOSIS — M7989 Other specified soft tissue disorders: Secondary | ICD-10-CM | POA: Diagnosis not present

## 2023-12-06 DIAGNOSIS — E039 Hypothyroidism, unspecified: Secondary | ICD-10-CM | POA: Diagnosis not present

## 2023-12-06 DIAGNOSIS — R2240 Localized swelling, mass and lump, unspecified lower limb: Secondary | ICD-10-CM | POA: Diagnosis not present

## 2023-12-06 DIAGNOSIS — J8410 Pulmonary fibrosis, unspecified: Secondary | ICD-10-CM | POA: Diagnosis not present

## 2023-12-06 DIAGNOSIS — I2089 Other forms of angina pectoris: Secondary | ICD-10-CM | POA: Diagnosis not present

## 2023-12-06 DIAGNOSIS — I10 Essential (primary) hypertension: Secondary | ICD-10-CM | POA: Diagnosis not present

## 2023-12-06 DIAGNOSIS — T50996A Underdosing of other drugs, medicaments and biological substances, initial encounter: Secondary | ICD-10-CM | POA: Diagnosis not present

## 2023-12-06 DIAGNOSIS — R9431 Abnormal electrocardiogram [ECG] [EKG]: Secondary | ICD-10-CM | POA: Diagnosis not present

## 2023-12-06 DIAGNOSIS — R519 Headache, unspecified: Secondary | ICD-10-CM | POA: Diagnosis not present

## 2023-12-06 DIAGNOSIS — K7290 Hepatic failure, unspecified without coma: Secondary | ICD-10-CM | POA: Diagnosis not present

## 2023-12-06 DIAGNOSIS — R0609 Other forms of dyspnea: Secondary | ICD-10-CM | POA: Diagnosis not present

## 2023-12-06 DIAGNOSIS — R1900 Intra-abdominal and pelvic swelling, mass and lump, unspecified site: Secondary | ICD-10-CM | POA: Diagnosis not present

## 2023-12-06 LAB — BASIC METABOLIC PANEL WITH GFR
BUN: 11 (ref 4–21)
CO2: 30 — AB (ref 13–22)
Chloride: 103 (ref 99–108)
Creatinine: 1.1 (ref 0.6–1.3)
Glucose: 97
Potassium: 4.3 meq/L (ref 3.5–5.1)
Sodium: 138 (ref 137–147)

## 2023-12-06 LAB — HEPATIC FUNCTION PANEL
ALT: 42 U/L — AB (ref 10–40)
AST: 44 — AB (ref 14–40)
Alkaline Phosphatase: 31 (ref 25–125)

## 2023-12-06 LAB — COMPREHENSIVE METABOLIC PANEL WITH GFR
Albumin: 4.3 (ref 3.5–5.0)
Calcium: 9.5 (ref 8.7–10.7)
eGFR: 80

## 2023-12-06 LAB — CBC AND DIFFERENTIAL
HCT: 38 — AB (ref 41–53)
Hemoglobin: 13.5 (ref 13.5–17.5)
Platelets: 162 K/uL (ref 150–400)
WBC: 5.7

## 2023-12-06 MED ORDER — OLMESARTAN MEDOXOMIL 40 MG PO TABS
40.0000 mg | ORAL_TABLET | Freq: Every day | ORAL | 0 refills | Status: DC
Start: 2023-12-06 — End: 2023-12-19

## 2023-12-07 DIAGNOSIS — R7989 Other specified abnormal findings of blood chemistry: Secondary | ICD-10-CM | POA: Diagnosis not present

## 2023-12-07 DIAGNOSIS — R079 Chest pain, unspecified: Secondary | ICD-10-CM | POA: Diagnosis not present

## 2023-12-07 DIAGNOSIS — R0602 Shortness of breath: Secondary | ICD-10-CM | POA: Diagnosis not present

## 2023-12-07 DIAGNOSIS — I160 Hypertensive urgency: Secondary | ICD-10-CM | POA: Diagnosis not present

## 2023-12-07 DIAGNOSIS — E039 Hypothyroidism, unspecified: Secondary | ICD-10-CM | POA: Diagnosis not present

## 2023-12-08 ENCOUNTER — Telehealth: Payer: Self-pay

## 2023-12-08 NOTE — Transitions of Care (Post Inpatient/ED Visit) (Signed)
   12/08/2023  Name: Bobby Rodriguez MRN: [id number] DOB: [birth year]  Today's TOC FU Call Status: Today's TOC FU Call Status:: Unsuccessful Call (1st Attempt) Unsuccessful Call (1st Attempt) Date: 12/08/23  Attempted to reach the patient regarding the most recent Inpatient/ED visit.  Follow Up Plan: Additional outreach attempts will be made to reach the patient to complete the Transitions of Care (Post Inpatient/ED visit) call.   Signature Julian Lemmings, LPN [facility] Nurse Health Advisor Direct Dial [phone]

## 2023-12-09 NOTE — Transitions of Care (Post Inpatient/ED Visit) (Unsigned)
   12/09/2023  Name: Bobby Rodriguez MRN: [id number] DOB: 1970-10-10  Today's TOC FU Call Status: Today's TOC FU Call Status:: Unsuccessful Call (2nd Attempt) Unsuccessful Call (1st Attempt) Date: 12/08/23 Unsuccessful Call (2nd Attempt) Date: 12/09/23  Attempted to reach the patient regarding the most recent Inpatient/ED visit.  Follow Up Plan: Additional outreach attempts will be made to reach the patient to complete the Transitions of Care (Post Inpatient/ED visit) call.   Signature Julian Lemmings, LPN [facility] Nurse Health Advisor Direct Dial [phone]

## 2023-12-12 NOTE — Transitions of Care (Post Inpatient/ED Visit) (Signed)
   12/12/2023  Name: TRE SANKER MRN: [id number] DOB: [birth year]  Today's TOC FU Call Status: Today's TOC FU Call Status:: Unsuccessful Call (3rd Attempt) Unsuccessful Call (1st Attempt) Date: 12/08/23 Unsuccessful Call (2nd Attempt) Date: 12/09/23 Unsuccessful Call (3rd Attempt) Date: 12/12/23  Attempted to reach the patient regarding the most recent Inpatient/ED visit.  Follow Up Plan: No further outreach attempts will be made at this time. We have been unable to contact the patient.  Signature Julian Lemmings, LPN [facility] Nurse Health Advisor Direct Dial [phone]

## 2023-12-19 ENCOUNTER — Ambulatory Visit: Admitting: Internal Medicine

## 2023-12-19 ENCOUNTER — Encounter: Payer: Self-pay | Admitting: Internal Medicine

## 2023-12-19 VITALS — BP 128/76 | HR 87 | Temp 97.8°F | Resp 16 | Ht 72.0 in | Wt 242.6 lb

## 2023-12-19 DIAGNOSIS — E039 Hypothyroidism, unspecified: Secondary | ICD-10-CM

## 2023-12-19 DIAGNOSIS — K2100 Gastro-esophageal reflux disease with esophagitis, without bleeding: Secondary | ICD-10-CM

## 2023-12-19 DIAGNOSIS — I10 Essential (primary) hypertension: Secondary | ICD-10-CM | POA: Diagnosis not present

## 2023-12-19 DIAGNOSIS — N522 Drug-induced erectile dysfunction: Secondary | ICD-10-CM

## 2023-12-19 DIAGNOSIS — R7989 Other specified abnormal findings of blood chemistry: Secondary | ICD-10-CM | POA: Insufficient documentation

## 2023-12-19 MED ORDER — OLMESARTAN MEDOXOMIL 40 MG PO TABS: 40.0000 mg | ORAL_TABLET | Freq: Every day | ORAL | 1 refills | Status: DC

## 2023-12-19 MED ORDER — PANTOPRAZOLE SODIUM 40 MG PO TBEC: 40.0000 mg | DELAYED_RELEASE_TABLET | Freq: Every day | ORAL | 1 refills | Status: DC

## 2023-12-19 MED ORDER — TADALAFIL 20 MG PO TABS: 20.0000 mg | ORAL_TABLET | Freq: Every day | ORAL | 5 refills | Status: AC | PRN

## 2023-12-19 NOTE — Patient Instructions (Signed)
 Hypertension, Adult High blood pressure (hypertension) is when the force of blood pumping through the arteries is too strong. The arteries are the blood vessels that carry blood from the heart throughout the body. Hypertension forces the heart to work harder to pump blood and may cause arteries to become narrow or stiff. Untreated or uncontrolled hypertension can lead to a heart attack, heart failure, a stroke, kidney disease, and other problems. A blood pressure reading consists of a higher number over a lower number. Ideally, your blood pressure should be below 120/80. The first ("top") number is called the systolic pressure. It is a measure of the pressure in your arteries as your heart beats. The second ("bottom") number is called the diastolic pressure. It is a measure of the pressure in your arteries as the heart relaxes. What are the causes? The exact cause of this condition is not known. There are some conditions that result in high blood pressure. What increases the risk? Certain factors may make you more likely to develop high blood pressure. Some of these risk factors are under your control, including: Smoking. Not getting enough exercise or physical activity. Being overweight. Having too much fat, sugar, calories, or salt (sodium) in your diet. Drinking too much alcohol. Other risk factors include: Having a personal history of heart disease, diabetes, high cholesterol, or kidney disease. Stress. Having a family history of high blood pressure and high cholesterol. Having obstructive sleep apnea. Age. The risk increases with age. What are the signs or symptoms? High blood pressure may not cause symptoms. Very high blood pressure (hypertensive crisis) may cause: Headache. Fast or irregular heartbeats (palpitations). Shortness of breath. Nosebleed. Nausea and vomiting. Vision changes. Severe chest pain, dizziness, and seizures. How is this diagnosed? This condition is diagnosed by  measuring your blood pressure while you are seated, with your arm resting on a flat surface, your legs uncrossed, and your feet flat on the floor. The cuff of the blood pressure monitor will be placed directly against the skin of your upper arm at the level of your heart. Blood pressure should be measured at least twice using the same arm. Certain conditions can cause a difference in blood pressure between your right and left arms. If you have a high blood pressure reading during one visit or you have normal blood pressure with other risk factors, you may be asked to: Return on a different day to have your blood pressure checked again. Monitor your blood pressure at home for 1 week or longer. If you are diagnosed with hypertension, you may have other blood or imaging tests to help your health care provider understand your overall risk for other conditions. How is this treated? This condition is treated by making healthy lifestyle changes, such as eating healthy foods, exercising more, and reducing your alcohol intake. You may be referred for counseling on a healthy diet and physical activity. Your health care provider may prescribe medicine if lifestyle changes are not enough to get your blood pressure under control and if: Your systolic blood pressure is above 130. Your diastolic blood pressure is above 80. Your personal target blood pressure may vary depending on your medical conditions, your age, and other factors. Follow these instructions at home: Eating and drinking  Eat a diet that is high in fiber and potassium, and low in sodium, added sugar, and fat. An example of this eating plan is called the DASH diet. DASH stands for Dietary Approaches to Stop Hypertension. To eat this way: Eat  plenty of fresh fruits and vegetables. Try to fill one half of your plate at each meal with fruits and vegetables. Eat whole grains, such as whole-wheat pasta, brown rice, or whole-grain bread. Fill about one  fourth of your plate with whole grains. Eat or drink low-fat dairy products, such as skim milk or low-fat yogurt. Avoid fatty cuts of meat, processed or cured meats, and poultry with skin. Fill about one fourth of your plate with lean proteins, such as fish, chicken without skin, beans, eggs, or tofu. Avoid pre-made and processed foods. These tend to be higher in sodium, added sugar, and fat. Reduce your daily sodium intake. Many people with hypertension should eat less than 1,500 mg of sodium a day. Do not drink alcohol if: Your health care provider tells you not to drink. You are pregnant, may be pregnant, or are planning to become pregnant. If you drink alcohol: Limit how much you have to: 0-1 drink a day for women. 0-2 drinks a day for men. Know how much alcohol is in your drink. In the [place] one drink equals one 12 oz bottle of beer (355 mL), one 5 oz glass of wine (148 mL), or one 1 oz glass of hard liquor (44 mL). Lifestyle  Work with your health care provider to maintain a healthy body weight or to lose weight. Ask what an ideal weight is for you. Get at least 30 minutes of exercise that causes your heart to beat faster (aerobic exercise) most days of the week. Activities may include walking, swimming, or biking. Include exercise to strengthen your muscles (resistance exercise), such as Pilates or lifting weights, as part of your weekly exercise routine. Try to do these types of exercises for 30 minutes at least 3 days a week. Do not use any products that contain nicotine or tobacco. These products include cigarettes, chewing tobacco, and vaping devices, such as e-cigarettes. If you need help quitting, ask your health care provider. Monitor your blood pressure at home as told by your health care provider. Keep all follow-up visits. This is important. Medicines Take over-the-counter and prescription medicines only as told by your health care provider. Follow directions carefully. Blood  pressure medicines must be taken as prescribed. Do not skip doses of blood pressure medicine. Doing this puts you at risk for problems and can make the medicine less effective. Ask your health care provider about side effects or reactions to medicines that you should watch for. Contact a health care provider if you: Think you are having a reaction to a medicine you are taking. Have headaches that keep coming back (recurring). Feel dizzy. Have swelling in your ankles. Have trouble with your vision. Get help right away if you: Develop a severe headache or confusion. Have unusual weakness or numbness. Feel faint. Have severe pain in your chest or abdomen. Vomit repeatedly. Have trouble breathing. These symptoms may be an emergency. Get help right away. Call 911. Do not wait to see if the symptoms will go away. Do not drive yourself to the hospital. Summary Hypertension is when the force of blood pumping through your arteries is too strong. If this condition is not controlled, it may put you at risk for serious complications. Your personal target blood pressure may vary depending on your medical conditions, your age, and other factors. For most people, a normal blood pressure is less than 120/80. Hypertension is treated with lifestyle changes, medicines, or a combination of both. Lifestyle changes include losing weight, eating a healthy,  low-sodium diet, exercising more, and limiting alcohol. This information is not intended to replace advice given to you by your health care provider. Make sure you discuss any questions you have with your health care provider. Document Revised: 04/07/2021 Document Reviewed: 04/07/2021 Elsevier Patient Education  2024 [employer].

## 2023-12-19 NOTE — Progress Notes (Unsigned)
 Subjective:  Patient ID: Bobby Rodriguez, male    DOB: [birth year]  Age: 53 y.o. MRN: [id number]  CC: Hypertension   HPI LEMAN MARTINEK presents for f/up -----  Discussed the use of AI scribe software for clinical note transcription with the patient, who gave verbal consent to proceed.  History of Present Illness   Bobby Rodriguez is a 53 year old male with hypertension who presents for a follow-up after a recent hospitalization.  Approximately a week and a half ago, he was hospitalized overnight at [facility] in [place], where he underwent cardiac testing and received medications. During his stay, he underwent echocardiograms and was administered medications including Lasix, which caused his blood pressure to drop significantly. He was discharged with a medication referred to as HCTZ.  Since the hospitalization, he has experienced persistent fatigue and a decreased ability to perform physical activities such as yard work and exercise, which he previously engaged in regularly. He describes feeling 'bottomed out' by the medications and has been adjusting his medication regimen on his own, including reducing the dose of Olmesartan  and discontinuing the diuretic.  He has experienced significant weight fluctuations, gaining twelve pounds during the hospitalization due to leg swelling, which has since resolved with a loss of appetite. His bowel movements have returned to normal.  No headaches, blurred vision, chest pain, or shortness of breath.  His current medications include Olmesartan , a medication for acid reflux, and Tadalafil . He expresses frustration with the healthcare system, particularly regarding medication access and the frequency of required follow-up visits. He lives in [place] and travels frequently for work, spending 15 to 18 days a month on the road.       His TSH was 6.3.  Outpatient Medications Prior to Visit  Medication Sig Dispense Refill   olmesartan   (BENICAR ) 40 MG tablet Take 1 tablet (40 mg total) by mouth daily. Please keep upcoming appointment for further refills. (Patient taking differently: Take 20 mg by mouth daily.) 13 tablet 0   pantoprazole  (PROTONIX ) 40 MG tablet Take 1 tablet (40 mg total) by mouth daily. 90 tablet 1   tadalafil  (CIALIS ) 20 MG tablet Take 1 tablet (20 mg total) by mouth daily as needed. 6 tablet 5   Albuterol -Budesonide (AIRSUPRA ) 90-80 MCG/ACT AERO Inhale 2 puffs into the lungs 4 (four) times daily as needed. 32.1 g 1   Cholecalciferol  (VITAMIN D3) 50 MCG (2000 UT) capsule TAKE 1 CAPSULE BY MOUTH EVERY DAY 90 capsule 1   levocetirizine (XYZAL ) 5 MG tablet TAKE 1 TABLET BY MOUTH EVERY DAY IN THE EVENING 90 tablet 0   Multiple Vitamin (MULTIVITAMIN ADULT PO) Take by mouth daily.     triamcinolone  (NASACORT ) 55 MCG/ACT AERO nasal inhaler Place 2 sprays into the nose daily. 97.2 mL 1   No facility-administered medications prior to visit.    ROS Review of Systems  Constitutional:  Positive for fatigue. Negative for appetite change, diaphoresis and unexpected weight change.  HENT: Negative.    Eyes:  Negative for visual disturbance.  Respiratory: Negative.  Negative for cough, chest tightness, shortness of breath and wheezing.   Cardiovascular:  Negative for chest pain, palpitations and leg swelling.  Gastrointestinal:  Negative for abdominal pain, constipation, diarrhea, nausea and vomiting.  Endocrine: Negative for cold intolerance and heat intolerance.  Genitourinary: Negative.   Musculoskeletal: Negative.   Skin: Negative.   Neurological: Negative.  Negative for dizziness and weakness.  Hematological:  Negative for adenopathy. Does not bruise/bleed easily.  Psychiatric/Behavioral: Negative.      Objective:  BP 128/76 (BP Location: Left Arm, Patient Position: Sitting, Cuff Size: Normal)   Pulse 87   Temp 97.8 F (36.6 C) (Oral)   Resp 16   Ht 6' (1.829 m)   Wt 242 lb 9.6 oz (110 kg)   SpO2 99%    [name] 32.90 kg/m   BP Readings from Last 3 Encounters:  12/19/23 128/76  03/14/23 128/78  10/18/22 (!) 99/45    Wt Readings from Last 3 Encounters:  12/19/23 242 lb 9.6 oz (110 kg)  03/14/23 246 lb (111.6 kg)  10/18/22 250 lb (113.4 kg)    Physical Exam Vitals reviewed.  Constitutional:      Appearance: Normal appearance.  HENT:     Mouth/Throat:     Mouth: Mucous membranes are moist.  Eyes:     General: No scleral icterus.    Conjunctiva/sclera: Conjunctivae normal.  Neck:     Thyroid : No thyroid  mass, thyromegaly or thyroid  tenderness.  Cardiovascular:     Rate and Rhythm: Normal rate and regular rhythm.     Heart sounds: No murmur heard.    No friction rub. No gallop.  Pulmonary:     Effort: Pulmonary effort is normal.     Breath sounds: No stridor. No wheezing, rhonchi or rales.  Abdominal:     General: Abdomen is flat.     Palpations: There is no mass.     Tenderness: There is no abdominal tenderness. There is no guarding.     Hernia: No hernia is present.  Musculoskeletal:        General: Normal range of motion.     Cervical back: Neck supple.     Right lower leg: No edema.     Left lower leg: No edema.  Lymphadenopathy:     Cervical: No cervical adenopathy.  Skin:    General: Skin is warm and dry.  Neurological:     General: No focal deficit present.     Mental Status: He is alert. Mental status is at baseline.  Psychiatric:        Mood and Affect: Mood normal.        Behavior: Behavior normal.     Lab Results  Component Value Date   WBC 5.7 12/06/2023   HGB 13.5 12/06/2023   HCT 38 (A) 12/06/2023   PLT 162 12/06/2023   GLUCOSE 96 03/14/2023   CHOL 219 (H) 03/14/2023   TRIG 82.0 03/14/2023   HDL 48.00 03/14/2023   LDLDIRECT 148.8 12/28/2012   LDLCALC 154 (H) 03/14/2023   ALT 42 (A) 12/06/2023   AST 44 (A) 12/06/2023   NA 138 12/06/2023   K 4.3 12/06/2023   CL 103 12/06/2023   CREATININE 1.1 12/06/2023   BUN 11 12/06/2023   CO2 30 (A)  12/06/2023   TSH 5.02 (H) 12/19/2023   PSA 1.06 03/14/2023   INR 0.9 08/23/2022   HGBA1C 5.1 08/23/2017    CT Maxillofacial W/Cm Result Date: 09/22/2022 CLINICAL DATA:  Nasal abscess.  Recurrent sinusitis. EXAM: CT MAXILLOFACIAL WITH CONTRAST TECHNIQUE: Multidetector CT imaging of the maxillofacial structures was performed with intravenous contrast. Multiplanar CT image reconstructions were also generated. RADIATION DOSE REDUCTION: This exam was performed according to the departmental dose-optimization program which includes automated exposure control, adjustment of the mA and/or kV according to patient size and/or use of iterative reconstruction technique. CONTRAST:  75mL ISOVUE -300 IOPAMIDOL  (ISOVUE -300) INJECTION 61% COMPARISON:  None Available. FINDINGS: Osseous: Remote nasal bone  fracture.  No destructive osseous lesion. Orbits: Unremarkable. Sinuses: Mild mucosal thickening inferiorly in the left frontal sinus with opacification of the left frontal recess. Mild left anterior ethmoid air cell mucosal thickening. The other paranasal sinuses are clear, and there are no fluid levels. Patent ostiomeatal units. Intact, midline nasal septum. Small amount of nodular soft tissue in the nasal cavity bilaterally at the level of the middle turbinates. Clear mastoid air cells and middle ear cavities. Soft tissues: Unremarkable. Limited intracranial: Unremarkable. IMPRESSION: 1. Mild left frontal and ethmoid sinus mucosal thickening. 2. Possible small bilateral nasal polyps. Electronically Signed   By: Dasie Hamburg M.D.   On: 09/22/2022 15:17    Assessment & Plan:  TSH elevation -     Thyroid  Panel With TSH; Future -     Thyroid  peroxidase antibody; Future  Gastroesophageal reflux disease with esophagitis, unspecified whether hemorrhage -     Pantoprazole  Sodium; Take 1 tablet (40 mg total) by mouth daily.  Dispense: 90 tablet; Refill: 1  Essential hypertension, benign- BP is well controlled. -      Olmesartan  Medoxomil; Take 1 tablet (40 mg total) by mouth daily.  Dispense: 90 tablet; Refill: 1  Drug-induced erectile dysfunction -     Tadalafil ; Take 1 tablet (20 mg total) by mouth daily as needed.  Dispense: 6 tablet; Refill: 5  Acquired hypothyroidism- Will start T4. -     Unithroid ; Take 1 tablet (50 mcg total) by mouth daily before breakfast.  Dispense: 90 tablet; Refill: 1     Follow-up: Return in about 6 months (around 06/20/2024).  Debby Molt, MD

## 2023-12-20 ENCOUNTER — Ambulatory Visit: Payer: Self-pay | Admitting: Internal Medicine

## 2023-12-20 LAB — THYROID PEROXIDASE ANTIBODY: Thyroperoxidase Ab SerPl-aCnc: <1 [IU]/mL (ref <9)

## 2023-12-20 LAB — THYROID PANEL WITH TSH
Free Thyroxine Index: 2.3 (ref 1.4–3.8)
T3 Uptake: 34 % (ref 22–35)
T4, Total: 6.7 ug/dL (ref 4.9–10.5)
TSH: 5.02 m[IU]/L — ABNORMAL HIGH (ref 0.40–4.50)

## 2023-12-20 MED ORDER — UNITHROID 50 MCG PO TABS: 50.0000 ug | ORAL_TABLET | Freq: Every day | ORAL | 1 refills | Status: DC

## 2024-06-14 ENCOUNTER — Other Ambulatory Visit: Payer: Self-pay | Admitting: Internal Medicine

## 2024-06-14 DIAGNOSIS — E039 Hypothyroidism, unspecified: Secondary | ICD-10-CM

## 2024-07-11 ENCOUNTER — Other Ambulatory Visit: Payer: Self-pay | Admitting: Internal Medicine

## 2024-07-11 DIAGNOSIS — K2100 Gastro-esophageal reflux disease with esophagitis, without bleeding: Secondary | ICD-10-CM

## 2024-07-11 DIAGNOSIS — I10 Essential (primary) hypertension: Secondary | ICD-10-CM
